# Patient Record
Sex: Female | Born: 1950 | Race: Black or African American | Hispanic: No | Smoking: Never smoker
Health system: Southern US, Community
[De-identification: ages and names within clinical notes are randomized; demographics above are authoritative.]

## PROBLEM LIST (undated history)

## (undated) DIAGNOSIS — R519 Headache, unspecified: Secondary | ICD-10-CM

## (undated) DIAGNOSIS — R51 Headache: Secondary | ICD-10-CM

## (undated) DIAGNOSIS — E119 Type 2 diabetes mellitus without complications: Secondary | ICD-10-CM

## (undated) DIAGNOSIS — G8929 Other chronic pain: Secondary | ICD-10-CM

---

## 1980-05-10 HISTORY — PX: ABDOMINAL HYSTERECTOMY: SHX81

## 1998-03-13 ENCOUNTER — Emergency Department (HOSPITAL_COMMUNITY): Admission: EM | Admit: 1998-03-13 | Discharge: 1998-03-13 | Payer: Self-pay | Admitting: Emergency Medicine

## 1998-03-13 ENCOUNTER — Encounter: Payer: Self-pay | Admitting: Emergency Medicine

## 1999-03-10 ENCOUNTER — Inpatient Hospital Stay (HOSPITAL_COMMUNITY): Admission: EM | Admit: 1999-03-10 | Discharge: 1999-03-13 | Payer: Self-pay | Admitting: Emergency Medicine

## 1999-03-10 ENCOUNTER — Encounter: Payer: Self-pay | Admitting: Emergency Medicine

## 1999-03-31 ENCOUNTER — Encounter: Admission: RE | Admit: 1999-03-31 | Discharge: 1999-03-31 | Payer: Self-pay | Admitting: Internal Medicine

## 1999-04-14 ENCOUNTER — Encounter: Admission: RE | Admit: 1999-04-14 | Discharge: 1999-04-14 | Payer: Self-pay | Admitting: Internal Medicine

## 1999-06-23 ENCOUNTER — Encounter: Admission: RE | Admit: 1999-06-23 | Discharge: 1999-06-23 | Payer: Self-pay | Admitting: Internal Medicine

## 1999-08-14 ENCOUNTER — Ambulatory Visit (HOSPITAL_COMMUNITY): Admission: RE | Admit: 1999-08-14 | Discharge: 1999-08-14 | Payer: Self-pay | Admitting: *Deleted

## 1999-08-14 ENCOUNTER — Encounter (INDEPENDENT_AMBULATORY_CARE_PROVIDER_SITE_OTHER): Payer: Self-pay | Admitting: *Deleted

## 2001-03-27 ENCOUNTER — Encounter: Payer: Self-pay | Admitting: Emergency Medicine

## 2001-03-27 ENCOUNTER — Emergency Department (HOSPITAL_COMMUNITY): Admission: EM | Admit: 2001-03-27 | Discharge: 2001-03-27 | Payer: Self-pay

## 2003-03-13 ENCOUNTER — Emergency Department (HOSPITAL_COMMUNITY): Admission: EM | Admit: 2003-03-13 | Discharge: 2003-03-13 | Payer: Self-pay | Admitting: Emergency Medicine

## 2008-05-02 ENCOUNTER — Emergency Department (HOSPITAL_COMMUNITY): Admission: EM | Admit: 2008-05-02 | Discharge: 2008-05-02 | Payer: Self-pay | Admitting: Emergency Medicine

## 2009-03-18 ENCOUNTER — Emergency Department (HOSPITAL_COMMUNITY): Admission: EM | Admit: 2009-03-18 | Discharge: 2009-03-18 | Payer: Self-pay | Admitting: Emergency Medicine

## 2010-04-02 ENCOUNTER — Emergency Department (HOSPITAL_COMMUNITY): Admission: EM | Admit: 2010-04-02 | Discharge: 2010-04-03 | Payer: Self-pay | Admitting: Emergency Medicine

## 2010-07-21 LAB — CBC
HCT: 36.4 % (ref 36.0–46.0)
Hemoglobin: 12.2 g/dL (ref 12.0–15.0)
MCHC: 33.5 g/dL (ref 30.0–36.0)
MCV: 93.5 fL (ref 78.0–100.0)

## 2010-07-21 LAB — URINALYSIS, ROUTINE W REFLEX MICROSCOPIC
Nitrite: NEGATIVE
Protein, ur: 30 mg/dL — AB
Specific Gravity, Urine: 1.01 (ref 1.005–1.030)
Urobilinogen, UA: 1 mg/dL (ref 0.0–1.0)

## 2010-07-21 LAB — HEPATIC FUNCTION PANEL
ALT: 16 U/L (ref 0–35)
AST: 20 U/L (ref 0–37)
Bilirubin, Direct: 0.1 mg/dL (ref 0.0–0.3)
Indirect Bilirubin: 0.9 mg/dL (ref 0.3–0.9)
Total Bilirubin: 1 mg/dL (ref 0.3–1.2)

## 2010-07-21 LAB — POCT CARDIAC MARKERS
CKMB, poc: <1 ng/mL — ABNORMAL LOW (ref 1.0–8.0)
Myoglobin, poc: 54.3 ng/mL (ref 12–200)

## 2010-07-21 LAB — URINE CULTURE: Culture  Setup Time: 201111250327

## 2010-07-21 LAB — DIFFERENTIAL
Basophils Relative: 0 % (ref 0–1)
Eosinophils Relative: 1 % (ref 0–5)
Monocytes Absolute: 0.2 10*3/uL (ref 0.1–1.0)
Monocytes Relative: 3 % (ref 3–12)
Neutro Abs: 6.2 10*3/uL (ref 1.7–7.7)

## 2010-07-21 LAB — CULTURE, BLOOD (ROUTINE X 2): Culture  Setup Time: 201111250039

## 2010-07-21 LAB — BASIC METABOLIC PANEL
BUN: 13 mg/dL (ref 6–23)
CO2: 25 mEq/L (ref 19–32)
Chloride: 104 mEq/L (ref 96–112)
GFR calc non Af Amer: >60 mL/min (ref >60)
Glucose, Bld: 132 mg/dL — ABNORMAL HIGH (ref 70–99)
Potassium: 3.5 mEq/L (ref 3.5–5.1)
Sodium: 140 mEq/L (ref 135–145)

## 2010-07-21 LAB — URINE MICROSCOPIC-ADD ON

## 2010-07-21 LAB — LACTIC ACID, PLASMA: Lactic Acid, Venous: 2.3 mmol/L — ABNORMAL HIGH (ref 0.5–2.2)

## 2010-09-25 NOTE — Procedures (Signed)
[place]  Patient:    Sandra Petersen, Sandra Petersen                  MRN: [id number] Proc. Date: 08/14/99 Adm. Date:  [id number] Disc. Date: [id number] Attending:  Mingo Amber CC:         Dr. Talmage Coin - [facility]                           Procedure Report  PROCEDURE:  Video upper endoscopy and video colonoscopy.  ENDOSCOPIST:  Roosvelt Harps, M.D.  INDICATIONS:  A 60 year old female with microcytic anemia of unclear etiology. Her hemoglobin checked in the office was 9.5 with an MCV of 82.5.  She has essentially no gastrointestinal symptoms except occasional nausea, but she has been taking Protonix for unclear reasons, probably reflux and she had been using ibuprofen or muscle aches and headaches.  This _______ has been discontinued.  PREPARATION:  She is n.p.o. since midnight.  In addition, she took Phospho-Soda  prep and a clear liquid diet yesterday.  The mucosa is clean.  PREPROCEDURE SEDATION:  Prior to the upper endoscopy, she received 70 mg of Demerol and 5 mg of Versed intravenously.  In addition, her throat was anesthetized with Hurricaine spray and was on 2 L of nasal cannula O2.  DESCRIPTION OF PROCEDURE:  The Olympus video upper endoscope was inserted via the mouth and advanced through the upper esophageal sphincter with ease. Intubation was then carried out well into the descending duodenum.  On withdrawal, the mucosa was carefully evaluated.  There was a suggestion of paucity of folds in the duodenum, thus biopsies were done to rule out sprue.  The duodenal bulb, antrum, and entire stomach appeared normal.  Retroflexed view of the GE junction was normal and the esophagus was entirely normal.  At the conclusion of this procedure, the patients position was reversed for procedure #2, video colonoscopy.  She required no further sedation.  The Olympus video colonoscope was inserted via the rectum, and  advanced very easily all the way to the cecum, and then through the ileocecal valve into the terminal ileum.  On withdrawal, the mucosa was carefully evaluated.  The terminal ileum nd entire colon including retroflexed view of the rectum were normal.  There was no area of inflammation, neoplasia, or diverticulosis seen.  The patient tolerated the procedures well.  Pulse, blood pressure, and oximetry  testing were stable throughout.  She was observed in recovery for 45 minutes and discharged home alert with a benign abdomen.  IMPRESSION:  Microcytic anemia, unexplained by endoscopic procedures.  There is a minimal suggestion of flattening of the villi and ________ folds in the small bowel.  Biopsy has been done.  PLAN:  The patient will be placed on ferrous gluconate 325 mg b.i.d.  She is to  return to the office in three weeks for recheck.  I probably will repeat CBC and iron studies at that time, and review small bowel biopsies with her. DD:  08/14/99 TD:  08/17/99 Job: [id number]

## 2012-01-18 ENCOUNTER — Encounter (HOSPITAL_COMMUNITY): Payer: Self-pay | Admitting: Emergency Medicine

## 2012-01-18 ENCOUNTER — Emergency Department (HOSPITAL_COMMUNITY)
Admission: EM | Admit: 2012-01-18 | Discharge: 2012-01-18 | Disposition: A | Discharge status: Home / Self Care | Payer: 59 | Attending: Emergency Medicine | Admitting: Emergency Medicine

## 2012-01-18 DIAGNOSIS — M545 Low back pain, unspecified: Secondary | ICD-10-CM | POA: Insufficient documentation

## 2012-01-18 MED ORDER — OXYCODONE-ACETAMINOPHEN 5-325 MG PO TABS: 1.0000 | ORAL_TABLET | Freq: Four times a day (QID) | ORAL | Status: AC | PRN

## 2012-01-18 MED ORDER — OXYCODONE-ACETAMINOPHEN 5-325 MG PO TABS
1.0000 | ORAL_TABLET | Freq: Once | ORAL | Status: AC
  Administered 2012-01-18: 1 via ORAL
  Filled 2012-01-18: qty 1

## 2012-01-18 NOTE — ED Notes (Signed)
Pt states her lower back hurts and the pain radiates upward and sometimes it causes her right leg to be painful and sometimes numb  Pt states her pain started on Sunday  Denies any recent injury

## 2012-01-18 NOTE — ED Provider Notes (Signed)
History     CSN: [id number]  Arrival date & time 01/18/12  4098   First MD Initiated Contact with Patient 01/18/12 [phone]      Chief Complaint  Patient presents with  . Back Pain    (Consider location/radiation/quality/duration/timing/severity/associated sxs/prior treatment) Patient is a 61 y.o. female presenting with back pain. The history is provided by the patient.  Back Pain  Pertinent negatives include no fever, no numbness, no abdominal pain, no dysuria and no weakness.   61 year old, female, with no significant past medical history presents emergency department complaining of lower back pain.  That extends up.  Her back, and into her right lower extremity.  The pain has been present for 3 days.  She denies trauma.  She denies nausea, vomiting, fevers, chills.  She denies dysuria, or hematuria.  She denies bowel or bladder incontinence.  She denies weakness in her lower extremities.  History reviewed. No pertinent past medical history.  History reviewed. No pertinent past surgical history.  Family History  Problem Relation Age of Onset  . Diabetes Sister     History  Substance Use Topics  . Smoking status: Never Smoker   . Smokeless tobacco: Not on file  . Alcohol Use: No    OB History    Grav Para Term Preterm Abortions TAB SAB Ect Mult Living                  Review of Systems  Constitutional: Negative for fever, chills, fatigue and unexpected weight change.  HENT: Negative for neck pain.   Gastrointestinal: Negative for nausea, vomiting and abdominal pain.  Genitourinary: Negative for dysuria and hematuria.  Musculoskeletal: Positive for back pain.  Neurological: Negative for weakness and numbness.    Allergies  Review of patient's allergies indicates no known allergies.  Home Medications  No current outpatient prescriptions on file.  BP 125/71  Pulse 65  Temp 98.6 F (37 C) (Oral)  Resp 18  SpO2 98%  Physical Exam  Nursing note and vitals  reviewed. Constitutional: She is oriented to person, place, and time. She appears well-developed and well-nourished. No distress.  HENT:  Head: Normocephalic and atraumatic.  Eyes: Conjunctivae and EOM are normal.  Neck: Normal range of motion. Neck supple.  Cardiovascular: Normal rate and intact distal pulses.   No murmur heard. Pulmonary/Chest: Effort normal and breath sounds normal. No respiratory distress.  Abdominal: Soft. Bowel sounds are normal. She exhibits no distension. There is no tenderness.  Musculoskeletal: Normal range of motion. She exhibits no edema.       Mild tenderness in the paravertebral area bilaterally, as well.  As over the bilateral SI joints  Neurological: She is alert and oriented to person, place, and time. No cranial nerve deficit.  Skin: Skin is warm and dry.  Psychiatric: She has a normal mood and affect. Thought content normal.    ED Course  Procedures (including critical care time) nontraumatic low back pain, with no signs of systemic illness.  No evidence of weakness in the lower extremities.  No tests indicated  Labs Reviewed - No data to display No results found.   No diagnosis found.    MDM  Low back pain        Cheri Guppy, MD 01/18/12 [phone]

## 2012-05-05 ENCOUNTER — Emergency Department (HOSPITAL_COMMUNITY)
Admission: EM | Admit: 2012-05-05 | Discharge: 2012-05-05 | Disposition: A | Discharge status: Other Acute Inpatient Hospital | Payer: 59 | Source: Home / Self Care | Attending: Emergency Medicine | Admitting: Emergency Medicine

## 2012-05-05 ENCOUNTER — Encounter (HOSPITAL_COMMUNITY): Payer: Self-pay

## 2012-05-05 ENCOUNTER — Emergency Department (HOSPITAL_COMMUNITY)
Admission: EM | Admit: 2012-05-05 | Discharge: 2012-05-06 | Disposition: A | Discharge status: Home / Self Care | Payer: 59 | Attending: Emergency Medicine | Admitting: Emergency Medicine

## 2012-05-05 ENCOUNTER — Encounter (HOSPITAL_COMMUNITY): Payer: Self-pay | Admitting: Emergency Medicine

## 2012-05-05 ENCOUNTER — Emergency Department (HOSPITAL_COMMUNITY): Payer: 59

## 2012-05-05 DIAGNOSIS — M79609 Pain in unspecified limb: Secondary | ICD-10-CM | POA: Insufficient documentation

## 2012-05-05 DIAGNOSIS — R51 Headache: Secondary | ICD-10-CM

## 2012-05-05 MED ORDER — DIPHENHYDRAMINE HCL 50 MG/ML IJ SOLN
25.0000 mg | Freq: Once | INTRAMUSCULAR | Status: AC
  Administered 2012-05-05: 25 mg via INTRAVENOUS
  Filled 2012-05-05: qty 1

## 2012-05-05 MED ORDER — SODIUM CHLORIDE 0.9 % IV BOLUS (SEPSIS)
1000.0000 mL | Freq: Once | INTRAVENOUS | Status: AC
  Administered 2012-05-05: 1000 mL via INTRAVENOUS

## 2012-05-05 MED ORDER — METOCLOPRAMIDE HCL 5 MG/ML IJ SOLN
5.0000 mg | Freq: Once | INTRAMUSCULAR | Status: AC
  Administered 2012-05-05: 5 mg via INTRAVENOUS
  Filled 2012-05-05: qty 2

## 2012-05-05 MED ORDER — DEXAMETHASONE SODIUM PHOSPHATE 10 MG/ML IJ SOLN
10.0000 mg | Freq: Once | INTRAMUSCULAR | Status: AC
  Administered 2012-05-05: 10 mg via INTRAVENOUS
  Filled 2012-05-05: qty 1

## 2012-05-05 NOTE — ED Notes (Signed)
Patient transported to CT 

## 2012-05-05 NOTE — ED Notes (Signed)
Pt was at dinner today with husband when headache started. Pt finished dinner and headache progressively worsened throughout the day. Pt has frequent headaches, but she states this headache is the worst. Reports headache is on the right side of head and spreads down to neck. Pt denies taking daily meds, denies hypertension, stress, allergies, nor dehydration. A&Ox4, ambulatory, stable, husband at bedside.

## 2012-05-05 NOTE — ED Notes (Signed)
PT. TRANSFERRED FROM [facility] THIS EVENING , REPORTS HEADACHE ONSET THIS AFTERNOON , DENIES INJURY , AMBULATORY , NO NAUSEA OR VOMITTING . DENIES BLURRED VISION . ALERT Sandra Petersen , SPEECH CLEAR/ NO FACIAL ASYMMETRY, EQUAL GRIPS.

## 2012-05-05 NOTE — ED Notes (Signed)
C/o HA today, not relieved w tylenol; denies other syx other than HA

## 2012-05-05 NOTE — ED Provider Notes (Signed)
Chief Complaint  Patient presents with  . Headache    History of Present Illness:   This is a 61 year old female who presents with a one-day history of a severe headache with sudden onset around 5 PM this evening while she was eating dinner. It involved the right side of her head and centered around her right ear. The patient states her neck hurts and feels stiff and she states this is the worst headache she's ever had. It is a throbbing headache rated at 13/10 in intensity. She denies any nausea, vomiting, photophobia, phonophobia, osmophobia. Her right leg feels numb. She has some aching in both hands but no numbness or tingling. There is no weakness of the arms or the legs. She denies any diplopia, blurred vision, or visual auras. She's had no fever, chills, nasal congestion, rhinorrhea, sore throat, or cough. The patient states she's had headaches for about 2-3 months off and on, these are similar but relieved by Tylenol and not nearly as severe. She denies any history of migraine headache. She denies any jaw claudication.  Review of Systems:  Other than noted above, the patient denies any of the following symptoms: Systemic:  No fever, chills, fatigue, photophobia, stiff neck. Eye:  No redness, eye pain, discharge, blurred vision, or diplopia. ENT:  No nasal congestion, rhinorrhea, sinus pressure or pain, sneezing, earache, or sore throat.  No jaw claudication. Neuro:  No paresthesias, loss of consciousness, seizure activity, muscle weakness, trouble with coordination or gait, trouble speaking or swallowing. Psych:  No depression, anxiety or trouble sleeping.  PMFSH:  Past medical history, family history, social history, meds, and allergies were reviewed.  Physical Exam:   Vital signs:  BP 143/82  Pulse 68  Temp 99 F (37.2 C) (Oral)  Resp 18  SpO2 99% General:  Alert and oriented.  In no distress. Eye:  Lids and conjunctivas normal.  PERRL,  Full EOMs.  Fundi benign with normal discs  and vessels. ENT:  No cranial or facial tenderness to palpation.  TMs and canals clear.  Nasal mucosa was normal and uncongested without any drainage. No intra oral lesions, pharynx clear, mucous membranes moist, dentition normal. Neck:  Supple, full ROM, no tenderness to palpation.  No adenopathy or mass. Neuro:  Alert and orented times 3.  Speech was clear, fluent, and appropriate.  Cranial nerves intact. No pronator drift, muscle strength normal. Finger to nose normal.  DTRs were 2+ and symmetrical.Station and gait were normal.  Romberg's sign was normal.  Able to perform tandem gait well. Psych:  Normal affect.  Assessment:  The encounter diagnosis was Headache.  Differential diagnosis is migraine headache, although this would be unusual for onset in a woman of her age. Aneurysmal headache as suggested by features of stiff neck, sudden onset, and her headache being described as the worst headache ever. At her age temporal arteritis would also be a possibility.  Plan:   1.  The following meds were prescribed:   New Prescriptions   No medications on file   2.  The patient was transported to the emergency department via shuttle.   Reuben Likes, MD 05/05/12 2017

## 2012-05-06 NOTE — ED Provider Notes (Signed)
Medical screening examination/treatment/procedure(s) were performed by non-physician practitioner and as supervising physician I was immediately available for consultation/collaboration.  Rasheed Welty L Abria Vannostrand, MD 05/06/12 1213 

## 2012-05-06 NOTE — ED Provider Notes (Signed)
History     CSN: [id number]  Arrival date & time 05/05/12  2023   None     Chief Complaint  Patient presents with  . Headache    (Consider location/radiation/quality/duration/timing/severity/associated sxs/prior treatment) HPI History provided by pt.   Pt presents w/ severe, right-sided headache.  Describes as gradual onset severe pressure, throbbing and sharp pain that started at 3-4pm today.  Radiates into neck.  Has not taken anything for pain.  No associated fever, photo/phonophobia, vision changes, dizziness, extremity weakness/paresthesias.  Has had RLE pain and tingling intermittently for a long time and attributes to being on her feet a lot at work.  Remote h/o migraines.   Denies recent head trauma.  History reviewed. No pertinent past medical history.  History reviewed. No pertinent past surgical history.  Family History  Problem Relation Age of Onset  . Diabetes Sister     History  Substance Use Topics  . Smoking status: Never Smoker   . Smokeless tobacco: Not on file  . Alcohol Use: No    OB History    Grav Para Term Preterm Abortions TAB SAB Ect Mult Living                  Review of Systems  All other systems reviewed and are negative.    Allergies  Review of patient's allergies indicates no known allergies.  Home Medications   Current Outpatient Rx  Name  Route  Sig  Dispense  Refill  . IBUPROFEN 200 MG PO TABS   Oral   Take 400 mg by mouth every 6 (six) hours as needed. For pain           BP 133/59  Pulse 69  Temp 98.4 F (36.9 C) (Oral)  Resp 18  SpO2 100%  Physical Exam  Nursing note and vitals reviewed. Constitutional: She is oriented to person, place, and time. She appears well-developed and well-nourished.  HENT:  Head: Normocephalic and atraumatic.       No tenderness of sinuses or temples.  No jaw claudication.   Eyes:       Normal appearance  Neck: Normal range of motion. Neck supple. No rigidity. No Brudzinski's sign  and no Kernig's sign noted.  Cardiovascular: Normal rate, regular rhythm and intact distal pulses.   Pulmonary/Chest: Effort normal and breath sounds normal.  Musculoskeletal: Normal range of motion.  Neurological: She is alert and oriented to person, place, and time. No sensory deficit. Coordination normal.       CN 3-12 intact.  No nystagmus.  5/5 and equal upper and lower extremity strength.  No past pointing.    Skin: Skin is warm and dry. No rash noted.  Psychiatric: She has a normal mood and affect. Her behavior is normal.    ED Course  Procedures (including critical care time)  Labs Reviewed - No data to display Ct Head Wo Contrast  05/05/2012  *RADIOLOGY REPORT*  Clinical Data: Severe headache.  CT HEAD WITHOUT CONTRAST  Technique:  Contiguous axial images were obtained from the base of the skull through the vertex without contrast.  Comparison: None.  Findings: There is no evidence of acute infarction, mass lesion, or intra- or extra-axial hemorrhage on CT.  The posterior fossa, including the cerebellum, brainstem and fourth ventricle, is within normal limits.  The third and lateral ventricles, and basal ganglia are unremarkable in appearance.  The cerebral hemispheres are symmetric in appearance, with normal gray- white differentiation.  No mass effect or  midline shift is seen.  There is no evidence of fracture; visualized osseous structures are unremarkable in appearance.  The visualized portions of the orbits are within normal limits.  There is mild partial opacification of the left maxillary sinus.  The remaining paranasal sinuses and mastoid air cells are well-aerated.  No significant soft tissue abnormalities are seen.  IMPRESSION:  1.  No acute intracranial pathology seen on CT. 2.  Mild partial opacification of the left maxillary sinus.   Original Report Authenticated By: Tonia Ghent, M.D.      1. Headache       MDM  [phone] F presents w/ severe, non-traumatic R-sided  headache.  Referred by [facility] for further evaluation and treatment.  I have very low clinical suspicion for intracranial hemorrhage; no trauma, not anti-coagulated, no h/o HTN, nml neurologic exam, no meningeal signs and nml head CT.  No tenderness of head/temple, fever or jaw claudication to suggest temporal arteritis.  Pain resolved w/ IV reglan, decadron, benadryl and NS.  Referred to neuro and [facility].  Return precautions discussed. 12:33 AM         Otilio Miu, PA-C 05/06/12 [phone]

## 2012-05-13 ENCOUNTER — Encounter (HOSPITAL_COMMUNITY): Payer: Self-pay | Admitting: Emergency Medicine

## 2012-05-13 ENCOUNTER — Emergency Department (HOSPITAL_COMMUNITY)
Admission: EM | Admit: 2012-05-13 | Discharge: 2012-05-13 | Disposition: A | Discharge status: Home / Self Care | Payer: 59 | Attending: Emergency Medicine | Admitting: Emergency Medicine

## 2012-05-13 DIAGNOSIS — G8929 Other chronic pain: Secondary | ICD-10-CM | POA: Insufficient documentation

## 2012-05-13 DIAGNOSIS — N390 Urinary tract infection, site not specified: Secondary | ICD-10-CM | POA: Insufficient documentation

## 2012-05-13 DIAGNOSIS — R51 Headache: Secondary | ICD-10-CM | POA: Insufficient documentation

## 2012-05-13 HISTORY — DX: Headache: R51

## 2012-05-13 HISTORY — DX: Headache, unspecified: R51.9

## 2012-05-13 HISTORY — DX: Other chronic pain: G89.29

## 2012-05-13 LAB — URINALYSIS, ROUTINE W REFLEX MICROSCOPIC
Bilirubin Urine: NEGATIVE
Ketones, ur: NEGATIVE mg/dL
Protein, ur: NEGATIVE mg/dL
Urobilinogen, UA: 1 mg/dL (ref 0.0–1.0)

## 2012-05-13 MED ORDER — CEPHALEXIN 500 MG PO CAPS: 500.0000 mg | ORAL_CAPSULE | Freq: Four times a day (QID) | ORAL | Status: DC

## 2012-05-13 MED ORDER — PHENAZOPYRIDINE HCL 200 MG PO TABS: 200.0000 mg | ORAL_TABLET | Freq: Three times a day (TID) | ORAL | Status: DC | PRN

## 2012-05-13 MED ORDER — CEPHALEXIN 500 MG PO CAPS
500.0000 mg | ORAL_CAPSULE | Freq: Once | ORAL | Status: AC
  Administered 2012-05-13: 500 mg via ORAL
  Filled 2012-05-13 (×2): qty 1

## 2012-05-13 MED ORDER — PHENAZOPYRIDINE HCL 200 MG PO TABS
200.0000 mg | ORAL_TABLET | Freq: Three times a day (TID) | ORAL | Status: DC | PRN
Start: 2012-05-13 — End: 2012-05-13

## 2012-05-13 NOTE — ED Notes (Addendum)
Patient reports burning pain on urination and denies fevers, N/V/D, or hematuria.

## 2012-05-13 NOTE — ED Provider Notes (Signed)
Medical screening examination/treatment/procedure(s) were performed by non-physician practitioner and as supervising physician I was immediately available for consultation/collaboration.   Celene Kras, MD 05/13/12 2127

## 2012-05-13 NOTE — ED Provider Notes (Signed)
History     CSN: [id number]  Arrival date & time 05/13/12  4098   First MD Initiated Contact with Patient 05/13/12 2012      Chief Complaint  Patient presents with  . Dysuria   HPI  History provided by the patient. Patient is a 62 year old female with no significant PMH who presents with complaints of dysuria for the past few days. Patient reports first noticing symptoms Wednesday evening. She has burning only with urination. She denies any urinary frequency, hematuria, abdominal pain, flank pain. She denies any fever, chills or sweats. Denies any vaginal bleeding or discharge. Denies any skin rashes or lesions. Symptoms are similar to previous UTI 2 years ago. Patient is not using treatment for symptoms. Denies any other aggravating or alleviating factors.    Past Medical History  Diagnosis Date  . Chronic headaches     History reviewed. No pertinent past surgical history.  Family History  Problem Relation Age of Onset  . Diabetes Sister     History  Substance Use Topics  . Smoking status: Never Smoker   . Smokeless tobacco: Not on file  . Alcohol Use: No    OB History    Grav Para Term Preterm Abortions TAB SAB Ect Mult Living                  Review of Systems  All other systems reviewed and are negative.    Allergies  Review of patient's allergies indicates no known allergies.  Home Medications   Current Outpatient Rx  Name  Route  Sig  Dispense  Refill  . IBUPROFEN 200 MG PO TABS   Oral   Take 400 mg by mouth every 6 (six) hours as needed. For pain           BP 129/69  Pulse 74  Temp 98.8 F (37.1 C) (Oral)  Resp 20  Ht 5\' 2"  (1.575 m)  Wt 135 lb (61.236 kg)  BMI 24.69 kg/m2  SpO2 99%  Physical Exam  Nursing note and vitals reviewed. Constitutional: She is oriented to person, place, and time. She appears well-developed and well-nourished. No distress.  HENT:  Head: Normocephalic.  Cardiovascular: Normal rate and regular rhythm.   No  murmur heard. Pulmonary/Chest: Effort normal and breath sounds normal. No respiratory distress. She has no wheezes. She has no rales.  Abdominal: Soft. She exhibits no distension. There is no tenderness. There is no rebound and no guarding.       No CVA tenderness  Neurological: She is alert and oriented to person, place, and time.  Skin: Skin is warm and dry.  Psychiatric: She has a normal mood and affect. Her behavior is normal.    ED Course  Procedures   Results for orders placed during the hospital encounter of 05/13/12  URINALYSIS, ROUTINE W REFLEX MICROSCOPIC      Component Value Range   Color, Urine YELLOW  YELLOW   APPearance CLEAR  CLEAR   Specific Gravity, Urine 1.016  1.005 - 1.030   pH 7.5  5.0 - 8.0   Glucose, UA NEGATIVE  NEGATIVE mg/dL   Hgb urine dipstick SMALL (*) NEGATIVE   Bilirubin Urine NEGATIVE  NEGATIVE   Ketones, ur NEGATIVE  NEGATIVE mg/dL   Protein, ur NEGATIVE  NEGATIVE mg/dL   Urobilinogen, UA 1.0  0.0 - 1.0 mg/dL   Nitrite NEGATIVE  NEGATIVE   Leukocytes, UA SMALL (*) NEGATIVE  URINE MICROSCOPIC-ADD ON      Component  Value Range   Squamous Epithelial / LPF RARE  RARE   WBC, UA 3-6  <3 WBC/hpf   RBC / HPF 0-2  <3 RBC/hpf   Bacteria, UA RARE  RARE     1. UTI (lower urinary tract infection)       MDM  8:10 PM patient seen and evaluated. Patient well-appearing in no acute distress. Patient does not appear severely ill or toxic.  Patient with symptoms suggestive of UTI. UA with slight signs of UTI. This may be early and developing. We'll give prescriptions for antibiotic.      Angus Seller, [place] 05/13/12 2043

## 2012-06-16 ENCOUNTER — Other Ambulatory Visit (INDEPENDENT_AMBULATORY_CARE_PROVIDER_SITE_OTHER): Payer: 59

## 2012-06-16 ENCOUNTER — Ambulatory Visit (INDEPENDENT_AMBULATORY_CARE_PROVIDER_SITE_OTHER): Payer: 59 | Admitting: Internal Medicine

## 2012-06-16 ENCOUNTER — Encounter: Payer: Self-pay | Admitting: Internal Medicine

## 2012-06-16 VITALS — BP 132/76 | HR 68 | Temp 99.2°F | Ht 62.0 in | Wt 146.5 lb

## 2012-06-16 DIAGNOSIS — Z131 Encounter for screening for diabetes mellitus: Secondary | ICD-10-CM

## 2012-06-16 DIAGNOSIS — Z1329 Encounter for screening for other suspected endocrine disorder: Secondary | ICD-10-CM

## 2012-06-16 DIAGNOSIS — Z0000 Encounter for general adult medical examination without abnormal findings: Secondary | ICD-10-CM

## 2012-06-16 DIAGNOSIS — Z1239 Encounter for other screening for malignant neoplasm of breast: Secondary | ICD-10-CM

## 2012-06-16 DIAGNOSIS — Z13228 Encounter for screening for other metabolic disorders: Secondary | ICD-10-CM

## 2012-06-16 DIAGNOSIS — Z13220 Encounter for screening for lipoid disorders: Secondary | ICD-10-CM

## 2012-06-16 DIAGNOSIS — Z1321 Encounter for screening for nutritional disorder: Secondary | ICD-10-CM

## 2012-06-16 DIAGNOSIS — Z130 Encounter for screening for diseases of the blood and blood-forming organs and certain disorders involving the immune mechanism: Secondary | ICD-10-CM

## 2012-06-16 LAB — BASIC METABOLIC PANEL
CO2: 29 mEq/L (ref 19–32)
Calcium: 9.7 mg/dL (ref 8.4–10.5)
Creatinine, Ser: 0.7 mg/dL (ref 0.4–1.2)
Glucose, Bld: 117 mg/dL — ABNORMAL HIGH (ref 70–99)
Sodium: 140 mEq/L (ref 135–145)

## 2012-06-16 LAB — CBC
HCT: 36.1 % (ref 36.0–46.0)
Hemoglobin: 12 g/dL (ref 12.0–15.0)
Platelets: 235 10*3/uL (ref 150.0–400.0)
RBC: 3.94 Mil/uL (ref 3.87–5.11)
WBC: 6.8 10*3/uL (ref 4.5–10.5)

## 2012-06-16 LAB — TSH: TSH: 1.72 u[IU]/mL (ref 0.35–5.50)

## 2012-06-16 LAB — LIPID PANEL
HDL: 44.8 mg/dL (ref >39.00)
LDL Cholesterol: 81 mg/dL (ref 0–99)
VLDL: 30.8 mg/dL (ref 0.0–40.0)

## 2012-06-16 NOTE — Patient Instructions (Signed)

## 2012-06-16 NOTE — Progress Notes (Signed)
HPI  Pt presents to the clinic today to establish care. She has not had a PCP in the past, just uses the ER for things that she needs. She has no concerns today.  Flu: never Tetanus: never: LMP: post menopausal Pap smear: > 4 years Mammogram: > 4 years Eye doctor: no Dentist: no  Past Medical History  Diagnosis Date  . Chronic headaches     Current Outpatient Prescriptions  Medication Sig Dispense Refill  . ibuprofen (ADVIL,MOTRIN) 200 MG tablet Take 400 mg by mouth every 6 (six) hours as needed. For pain        No Known Allergies  Family History  Problem Relation Age of Onset  . Diabetes Sister   . Hypertension Other     Parent    History   Social History  . Marital Status: Married    Spouse Name: N/A    Number of Children: N/A  . Years of Education: 12   Occupational History  . [employer]    Social History Main Topics  . Smoking status: Never Smoker   . Smokeless tobacco: Never Used  . Alcohol Use: No  . Drug Use: No  . Sexually Active:    Other Topics Concern  . Not on file   Social History Narrative   Regular exercise-yesCaffeine Use-yes    ROS:  Constitutional: Denies fever, malaise, fatigue, headache or abrupt weight changes.  HEENT: Denies eye pain, eye redness, ear pain, ringing in the ears, wax buildup, runny nose, nasal congestion, bloody nose, or sore throat. Respiratory: Denies difficulty breathing, shortness of breath, cough or sputum production.   Cardiovascular: Denies chest pain, chest tightness, palpitations or swelling in the hands or feet.  Gastrointestinal: Denies abdominal pain, bloating, constipation, diarrhea or blood in the stool.  GU: Denies frequency, urgency, pain with urination, blood in urine, odor or discharge. Musculoskeletal: Denies decrease in range of motion, difficulty with gait, muscle pain or joint pain and swelling.  Skin: Denies redness, rashes, lesions or ulcercations.  Neurological: Denies dizziness, difficulty  with memory, difficulty with speech or problems with balance and coordination.   No other specific complaints in a complete review of systems (except as listed in HPI above).  PE:  BP 132/76  Pulse 68  Temp 99.2 F (37.3 C) (Oral)  Ht 5\' 2"  (1.575 m)  Wt 146 lb 8 oz (66.452 kg)  BMI 26.80 kg/m2  SpO2 98% Wt Readings from Last 3 Encounters:  06/16/12 146 lb 8 oz (66.452 kg)  05/13/12 135 lb (61.236 kg)    General: Appears her stated age, well developed, well nourished in NAD. HEENT: Head: normal shape and size; Eyes: sclera white, no icterus, conjunctiva pink, PERRLA and EOMs intact; Ears: Tm's gray and intact, normal light reflex; Nose: mucosa pink and moist, septum midline; Throat/Mouth: Teeth present, mucosa pink and moist, no lesions or ulcerations noted.  Neck: Normal range of motion. Neck supple, trachea midline. No massses, lumps or thyromegaly present.  Cardiovascular: Normal rate and rhythm. S1,S2 noted.  No murmur, rubs or gallops noted. No JVD or BLE edema. No carotid bruits noted. Pulmonary/Chest: Normal effort and positive vesicular breath sounds. No respiratory distress. No wheezes, rales or ronchi noted.  Abdomen: Soft and nontender. Normal bowel sounds, no bruits noted. No distention or masses noted. Liver, spleen and kidneys non palpable. Musculoskeletal: Normal range of motion. No signs of joint swelling. No difficulty with gait.  Neurological: Alert and oriented. Cranial nerves II-XII intact. Coordination normal. +DTRs bilaterally. Psychiatric:  Mood and affect normal. Behavior is normal. Judgment and thought content normal.    Assessment and Plan:  Preventative Health Maintenance:  Start a diet and exercise program Will obtain basic screening labs today Pt declines flu, tetanus, colonoscopy and pap smear Will set up for mammogram  RTC in 1 year or sooner if needed

## 2012-06-17 LAB — VITAMIN D 25 HYDROXY (VIT D DEFICIENCY, FRACTURES): Vit D, 25-Hydroxy: 11 ng/mL — ABNORMAL LOW (ref 30–89)

## 2012-06-21 ENCOUNTER — Ambulatory Visit (INDEPENDENT_AMBULATORY_CARE_PROVIDER_SITE_OTHER): Payer: 59 | Admitting: Internal Medicine

## 2012-06-21 ENCOUNTER — Encounter: Payer: Self-pay | Admitting: Internal Medicine

## 2012-06-21 VITALS — BP 132/72 | HR 64 | Temp 98.1°F | Ht 62.0 in | Wt 147.0 lb

## 2012-06-21 DIAGNOSIS — E559 Vitamin D deficiency, unspecified: Secondary | ICD-10-CM

## 2012-06-21 DIAGNOSIS — E119 Type 2 diabetes mellitus without complications: Secondary | ICD-10-CM

## 2012-06-21 MED ORDER — ERGOCALCIFEROL 1.25 MG (50000 UT) PO CAPS: 50000.0000 [IU] | ORAL_CAPSULE | ORAL | Status: DC

## 2012-06-21 MED ORDER — METFORMIN HCL 500 MG PO TABS: 500.0000 mg | ORAL_TABLET | Freq: Two times a day (BID) | ORAL | Status: DC

## 2012-06-21 NOTE — Assessment & Plan Note (Signed)
Counseled patient on diet and exercise changes Showed patient how to use blood sugar machine Referred pt to diabetes and nutritional education eRx for Metformin BID

## 2012-06-21 NOTE — Patient Instructions (Signed)
Diabetes and Exercise Regular exercise is important and can help:   Control blood glucose (sugar).  Decrease blood pressure.    Control blood lipids (cholesterol, triglycerides).  Improve overall health. BENEFITS FROM EXERCISE  Improved fitness.  Improved flexibility.  Improved endurance.  Increased bone density.  Weight control.  Increased muscle strength.  Decreased body fat.  Improvement of the body's use of insulin, a hormone.  Increased insulin sensitivity.  Reduction of insulin needs.  Reduced stress and tension.  Helps you feel better. People with diabetes who add exercise to their lifestyle gain additional benefits, including:  Weight loss.  Reduced appetite.  Improvement of the body's use of blood glucose.  Decreased risk factors for heart disease:  Lowering of cholesterol and triglycerides.  Raising the level of good cholesterol (high-density lipoproteins, HDL).  Lowering blood sugar.  Decreased blood pressure. TYPE 1 DIABETES AND EXERCISE  Exercise will usually lower your blood glucose.  If blood glucose is greater than 240 mg/dl, check urine ketones. If ketones are present, do not exercise.  Location of the insulin injection sites may need to be adjusted with exercise. Avoid injecting insulin into areas of the body that will be exercised. For example, avoid injecting insulin into:  The arms when playing tennis.  The legs when jogging. For more information, discuss this with your caregiver.  Keep a record of:  Food intake.  Type and amount of exercise.  Expected peak times of insulin action.  Blood glucose levels. Do this before, during, and after exercise. Review your records with your caregiver. This will help you to develop guidelines for adjusting food intake and insulin amounts.  TYPE 2 DIABETES AND EXERCISE  Regular physical activity can help control blood glucose.  Exercise is important because it may:  Increase the  body's sensitivity to insulin.  Improve blood glucose control.  Exercise reduces the risk of heart disease. It decreases serum cholesterol and triglycerides. It also lowers blood pressure.  Those who take insulin or oral hypoglycemic agents should watch for signs of hypoglycemia. These signs include dizziness, shaking, sweating, chills, and confusion.  Body water is lost during exercise. It must be replaced. This will help to avoid loss of body fluids (dehydration) or heat stroke. Be sure to talk to your caregiver before starting an exercise program to make sure it is safe for you. Remember, any activity is better than none.  Document Released: 07/17/2003 Document Revised: 07/19/2011 Document Reviewed: 10/31/2008 [facility] Patient Information 2013 [place], [place]. Diabetes and Foot Care Diabetes may cause you to have a poor blood supply (circulation) to your legs and feet. Because of this, the skin may be thinner, break easier, and heal more slowly. You also may have nerve damage in your legs and feet causing decreased feeling. You may not notice minor injuries to your feet that could lead to serious problems or infections. Taking care of your feet is one of the most important things you can do for yourself.  HOME CARE INSTRUCTIONS  Do not go barefoot. Bare feet are easily injured.  Check your feet daily for blisters, cuts, and redness.  Wash your feet with warm water (not hot) and mild soap. Pat your feet and between your toes until completely dry.  Apply a moisturizing lotion that does not contain alcohol or petroleum jelly to the dry skin on your feet and to dry brittle toenails. Do not put it between your toes.  Trim your toenails straight across. Do not dig under them or around  the cuticle.  Do not cut corns or calluses, or try to remove them with medicine.  Wear clean cotton socks or stockings every day. Make sure they are not too tight. Do not wear knee high stockings since they may  decrease blood flow to your legs.  Wear leather shoes that fit properly and have enough cushioning. To break in new shoes, wear them just a few hours a day to avoid injuring your feet.  Wear shoes at all times, even in the house.  Do not cross your legs. This may decrease the blood flow to your feet.  If you find a minor scrape, cut, or break in the skin on your feet, keep it and the skin around it clean and dry. These areas may be cleansed with mild soap and water. Do not use peroxide, alcohol, iodine or Merthiolate.  When you remove an adhesive bandage, be sure not to harm the skin around it.  If you have a wound, look at it several times a day to make sure it is healing.  Do not use heating pads or hot water bottles. Burns can occur. If you have lost feeling in your feet or legs, you may not know it is happening until it is too late.  Report any cuts, sores or bruises to your caregiver. Do not wait! SEEK MEDICAL CARE IF:   You have an injury that is not healing or you notice redness, numbness, burning, or tingling.  Your feet always feel cold.  You have pain or cramps in your legs and feet. SEEK IMMEDIATE MEDICAL CARE IF:   There is increasing redness, swelling, or increasing pain in the wound.  There is a red line that goes up your leg.  Pus is coming from a wound.  You develop an unexplained oral temperature above 102 F (38.9 C), or as your caregiver suggests.  You notice a bad smell coming from an ulcer or wound. MAKE SURE YOU:   Understand these instructions.  Will watch your condition.  Will get help right away if you are not doing well or get worse. Document Released: 04/23/2000 Document Revised: 07/19/2011 Document Reviewed: 10/30/2008 [facility] Patient Information 2013 [place], [place]. Diabetes and Standards of Medical Care  Diabetes is complicated. You may find that your diabetes team includes a dietitian, nurse, diabetes educator, eye doctor, and more. To  help everyone know what is going on and to help you get the care you deserve, the following schedule of care was developed to help keep you on track. Below are the tests, exams, vaccines, medicines, education, and plans you will need. A1c test  Performed at least 2 times a year if you are meeting treatment goals.  Performed 4 times a year if therapy has changed or if you are not meeting therapy/glycemic goals. Aspirin medicine  Take daily as directed by your caregiver. Blood pressure test  Performed at every routine medical visit. The goal is less than 130/80 mm/Hg. Dental exam  Get a dental exam at least 2 times a year. Dilated eye exam (retinal exam)  Type 1 diabetes: Get an exam within 5 years of diagnosis and then yearly.  Type 2 diabetes: Get an exam at diagnosis and then yearly. All exams thereafter can be extended to every 2 to 3 years if one or more exams have been normal. Foot care exam  Visual foot exams are performed at every routine medical visit. The exams check for cuts, injuries, or other problems with the feet.  A comprehensive foot exam should be done yearly. This includes visual inspection as well as assessing foot pulses and testing for loss of sensation. Kidney function test (urine microalbumin)  Performed once a year.  Type 1 diabetes: The first test is performed 5 years after diagnosis.  Type 2 diabetes: The first test is performed at the time of diagnosis.  A serum creatinine and estimated glomerular filtration rate (eGFR) test is done once a year to tell the level of chronic kidney disease (CKD), if present. Lipid profile (Cholesterol, HDL, LDL, Triglycerides)  Performed once a year for most people. If at low risk, may be assessed every 2 years.  The goal for LDL is less than 100 mg/dl. If at high risk, the goal is less than 70 mg/dl.  The goal for HDL is higher than 40 mg/dl for men and higher than 50 mg/dl for women.  The goal for triglycerides is  less than 150 mg/dl. Flu vaccine, pneumonia vaccine, and hepatitis B vaccine  The flu vaccine is recommended yearly.  The pneumonia vaccine is generally given once in a lifetime. However, there are some instances where another vaccine is recommended. Check with your caregiver.  The hepatitis B vaccine is also recommended for adults with diabetes. Diabetes self-management education  Recommended at diagnosis and ongoing as needed. Treatment plan  Reviewed at every medical visit. Document Released: 02/21/2009 Document Revised: 07/19/2011 Document Reviewed: 10/27/2010 [facility] Patient Information 2013 [place], [place]. Diabetes Meal Planning Guide The diabetes meal planning guide is a tool to help you plan your meals and snacks. It is important for people with diabetes to manage their blood glucose (sugar) levels. Choosing the right foods and the right amounts throughout your day will help control your blood glucose. Eating right can even help you improve your blood pressure and reach or maintain a healthy weight. CARBOHYDRATE COUNTING MADE EASY When you eat carbohydrates, they turn to sugar. This raises your blood glucose level. Counting carbohydrates can help you control this level so you feel better. When you plan your meals by counting carbohydrates, you can have more flexibility in what you eat and balance your medicine with your food intake. Carbohydrate counting simply means adding up the total amount of carbohydrate grams in your meals and snacks. Try to eat about the same amount at each meal. Foods with carbohydrates are listed below. Each portion below is 1 carbohydrate serving or 15 grams of carbohydrates. Ask your dietician how many grams of carbohydrates you should eat at each meal or snack. Grains and Starches  1 slice bread.   English muffin or hotdog/hamburger bun.   cup cold cereal (unsweetened).   cup cooked pasta or rice.   cup starchy vegetables (corn, potatoes, peas,  beans, winter squash).  1 tortilla (6 inches).   bagel.  1 waffle or pancake (size of a CD).   cup cooked cereal.  4 to 6 small crackers. *Whole grain is recommended. Fruit  1 cup fresh unsweetened berries, melon, papaya, pineapple.  1 small fresh fruit.   banana or mango.   cup fruit juice (4 oz unsweetened).   cup canned fruit in natural juice or water.  2 tbs dried fruit.  12 to 15 grapes or cherries. Milk and Yogurt  1 cup fat-free or 1% milk.  1 cup soy milk.  6 oz light yogurt with sugar-free sweetener.  6 oz low-fat soy yogurt.  6 oz plain yogurt. Vegetables  1 cup raw or  cup cooked is counted as  0 carbohydrates or a "free" food.  If you eat 3 or more servings at 1 meal, count them as 1 carbohydrate serving. Other Carbohydrates   oz chips or pretzels.   cup ice cream or frozen yogurt.   cup sherbet or sorbet.  2 inch square cake, no frosting.  1 tbs honey, sugar, jam, jelly, or syrup.  2 small cookies.  3 squares of graham crackers.  3 cups popcorn.  6 crackers.  1 cup broth-based soup.  Count 1 cup casserole or other mixed foods as 2 carbohydrate servings.  Foods with less than 20 calories in a serving may be counted as 0 carbohydrates or a "free" food. You may want to purchase a book or computer software that lists the carbohydrate gram counts of different foods. In addition, the nutrition facts panel on the labels of the foods you eat are a good source of this information. The label will tell you how big the serving size is and the total number of carbohydrate grams you will be eating per serving. Divide this number by 15 to obtain the number of carbohydrate servings in a portion. Remember, 1 carbohydrate serving equals 15 grams of carbohydrate. SERVING SIZES Measuring foods and serving sizes helps you make sure you are getting the right amount of food. The list below tells how big or small some common serving sizes are.  1  oz.........4 stacked dice.  3 oz........[place] KitchenDeck of cards.  1 tsp.......[place] KitchenTip of little finger.  1 tbs......Marland KitchenMarland KitchenThumb.  2 tbs.......Marland KitchenGolf ball.   cup......Marland KitchenHalf of a fist.  1 cup.......Marland KitchenA fist. SAMPLE DIABETES MEAL PLAN Below is a sample meal plan that includes foods from the grain and starches, dairy, vegetable, fruit, and meat groups. A dietician can individualize a meal plan to fit your calorie needs and tell you the number of servings needed from each food group. However, controlling the total amount of carbohydrates in your meal or snack is more important than making sure you include all of the food groups at every meal. You may interchange carbohydrate containing foods (dairy, starches, and fruits). The meal plan below is an example of a 2000 calorie diet using carbohydrate counting. This meal plan has 17 carbohydrate servings. Breakfast  1 cup oatmeal (2 carb servings).   cup light yogurt (1 carb serving).  1 cup blueberries (1 carb serving).   cup almonds. Snack  1 large apple (2 carb servings).  1 low-fat string cheese stick. Lunch  Chicken breast salad.  1 cup spinach.   cup chopped tomatoes.  2 oz chicken breast, sliced.  2 tbs low-fat [place] dressing.  12 whole-wheat crackers (2 carb servings).  12 to 15 grapes (1 carb serving).  1 cup low-fat milk (1 carb serving). Snack  1 cup carrots.   cup hummus (1 carb serving). Dinner  3 oz broiled salmon.  1 cup brown rice (3 carb servings). Snack  1  cups steamed broccoli (1 carb serving) drizzled with 1 tsp olive oil and lemon juice.  1 cup light pudding (2 carb servings). DIABETES MEAL PLANNING WORKSHEET Your dietician can use this worksheet to help you decide how many servings of foods and what types of foods are right for you.  BREAKFAST Food Group and Servings / Carb Servings Grain/Starches __________________________________ Dairy __________________________________________ Vegetable  ______________________________________ Fruit ___________________________________________ Meat __________________________________________ Fat ____________________________________________ LUNCH Food Group and Servings / Carb Servings Grain/Starches ___________________________________ Dairy ___________________________________________ Fruit ____________________________________________ Meat ___________________________________________ Fat _____________________________________________ Laural [employer] Food Group and Servings / Carb Servings Grain/Starches ___________________________________ [employer]  ___________________________________________ Fruit ____________________________________________ Meat ___________________________________________ Fat _____________________________________________ SNACKS Food Group and Servings / Carb Servings Grain/Starches ___________________________________ Dairy ___________________________________________ Vegetable _______________________________________ Fruit ____________________________________________ Meat ___________________________________________ Fat _____________________________________________ DAILY TOTALS Starches _________________________ Vegetable ________________________ Fruit ____________________________ Dairy ____________________________ Meat ____________________________ Fat ______________________________ Document Released: 01/21/2005 Document Revised: 07/19/2011 Document Reviewed: 12/02/2008 [employer] Patient Information 2013 [place], [place].

## 2012-06-21 NOTE — Assessment & Plan Note (Signed)
Counseled pt about weight bearing exercise eRx for ergocalciferol weekly x 12 weeks

## 2012-06-21 NOTE — Progress Notes (Signed)
Subjective:    Patient ID: Sandra Petersen, female    DOB: 1950/08/12, 62 y.o.   MRN: [id number]  HPI  Pt presents to the clinic today to f/u on her lab work. Her labs from last week indicated that she is diabetic and also that her Vit D was low.   Review of Systems      Past Medical History  Diagnosis Date  . Chronic headaches     Current Outpatient Prescriptions  Medication Sig Dispense Refill  . ibuprofen (ADVIL,MOTRIN) 200 MG tablet Take 400 mg by mouth every 6 (six) hours as needed. For pain       No current facility-administered medications for this visit.    No Known Allergies  Family History  Problem Relation Age of Onset  . Diabetes Sister   . Hypertension Other     Parent    History   Social History  . Marital Status: Married    Spouse Name: N/A    Number of Children: N/A  . Years of Education: 12   Occupational History  . [employer]    Social History Main Topics  . Smoking status: Never Smoker   . Smokeless tobacco: Never Used  . Alcohol Use: No  . Drug Use: No  . Sexually Active: Yes    Birth Control/ Protection: Post-menopausal   Other Topics Concern  . Not on file   Social History Narrative   Regular exercise-yes   Caffeine Use-yes     Constitutional: Pt reports fatigue. Denies fever, malaise, headache or abrupt weight changes.  Respiratory: Denies difficulty breathing, shortness of breath, cough or sputum production.   Cardiovascular: Denies chest pain, chest tightness, palpitations or swelling in the hands or feet.  Gastrointestinal: Denies increased thirst, abdominal pain, bloating, constipation, diarrhea or blood in the stool.  GU: Denies urgency, frequency, pain with urination, burning sensation, blood in urine, odor or discharge. Neurological: Denies numbness or tingling in the hands or feet, dizziness, difficulty with memory, difficulty with speech or problems with balance and coordination.   No other specific complaints in a  complete review of systems (except as listed in HPI above).  Objective:   Physical Exam  BP 132/72  Pulse 64  Temp(Src) 98.1 F (36.7 C) (Oral)  Ht 5\' 2"  (1.575 m)  Wt 147 lb (66.679 kg)  BMI 26.88 kg/m2  SpO2 95% Wt Readings from Last 3 Encounters:  06/21/12 147 lb (66.679 kg)  06/16/12 146 lb 8 oz (66.452 kg)  05/13/12 135 lb (61.236 kg)    General: Appears her stated age, overweight but well developed, well nourished in NAD.  Cardiovascular: Normal rate and rhythm. S1,S2 noted.  No murmur, rubs or gallops noted. No JVD or BLE edema. No carotid bruits noted. Pulmonary/Chest: Normal effort and positive vesicular breath sounds. No respiratory distress. No wheezes, rales or ronchi noted.  Abdomen: Soft and nontender. Normal bowel sounds, no bruits noted. No distention or masses noted. Liver, spleen and kidneys non palpable. Neurological: Alert and oriented. Cranial nerves II-XII intact. Coordination normal. +DTRs bilaterally. Sensation intact to light and sharp touch.   BMET    Component Value Date/Time   NA 140 06/16/2012 1006   K 4.3 06/16/2012 1006   CL 104 06/16/2012 1006   CO2 29 06/16/2012 1006   GLUCOSE 117* 06/16/2012 1006   BUN 16 06/16/2012 1006   CREATININE 0.7 06/16/2012 1006   CALCIUM 9.7 06/16/2012 1006   GFRNONAA >60 04/02/2010 2115   GFRAA  Value: >60  The eGFR has been calculated using the MDRD equation. This calculation has not been validated in all clinical situations. eGFR's persistently <60 mL/min signify possible Chronic Kidney Disease. 04/02/2010 2115    Lipid Panel     Component Value Date/Time   CHOL 157 06/16/2012 1006   TRIG 154.0* 06/16/2012 1006   HDL 44.80 06/16/2012 1006   CHOLHDL 4 06/16/2012 1006   VLDL 30.8 06/16/2012 1006   LDLCALC 81 06/16/2012 1006    CBC    Component Value Date/Time   WBC 6.8 06/16/2012 1006   RBC 3.94 06/16/2012 1006   HGB 12.0 06/16/2012 1006   HCT 36.1 06/16/2012 1006   PLT 235.0 06/16/2012 1006   MCV 91.7 06/16/2012 1006   MCH 31.3  04/02/2010 2115   MCHC 33.2 06/16/2012 1006   RDW 12.9 06/16/2012 1006   LYMPHSABS 1.4 04/02/2010 2115   MONOABS 0.2 04/02/2010 2115   EOSABS 0.1 04/02/2010 2115   BASOSABS 0.0 04/02/2010 2115    Hgb A1C Lab Results  Component Value Date   HGBA1C 7.1* 06/16/2012         Assessment & Plan:   Greater than 50% of time spent counseling patient on diet, exercise, standards of medical therapy. Approx time spent with patient 35 minutes.  RTC in 3 months to recheck HgbA1C and Vit D level

## 2012-06-23 ENCOUNTER — Ambulatory Visit (HOSPITAL_COMMUNITY): Payer: 59

## 2012-06-26 ENCOUNTER — Telehealth: Payer: Self-pay | Admitting: Internal Medicine

## 2012-06-26 ENCOUNTER — Other Ambulatory Visit: Payer: Self-pay | Admitting: *Deleted

## 2012-06-26 MED ORDER — GLUCOSE BLOOD VI STRP: ORAL_STRIP | Status: DC

## 2012-06-26 MED ORDER — ONETOUCH DELICA LANCETS FINE MISC: 1.0000 | Freq: Two times a day (BID) | Status: DC

## 2012-06-26 NOTE — Telephone Encounter (Signed)
Rx for strips and lancets sent to [facility].

## 2012-06-26 NOTE — Telephone Encounter (Signed)
Pt's spouse called for refill of test strips and lancets for new meter. Rx's sent to pharmacy.

## 2012-06-26 NOTE — Telephone Encounter (Signed)
Ash, Can we call in strips and lancets for the one touch verio IQ machine [address]

## 2012-06-26 NOTE — Telephone Encounter (Signed)
[facility] Triage Call Report Triage Record Num: [id number] Operator: Cornell Barman Patient Name: Sandra Petersen Call Date & Time: 06/22/2012 8:01:45AM Patient Phone: [phone] PCP: Nicki Reaper Patient Gender: Female PCP Fax : Patient DOB: [birth year] [facility] Name: [facility] Reason for Call: Caller: Latiana/Patient; PCP: Nicki Reaper; CB#: [phone]; Call regarding ; Husband to the phone. He states they picked up glucose machine. It only came with 10 strips. They are checking blood sugar four times a day and need more strips. They have enough for today and 2 checks tomorrow. Reviewed EPIC. There is no information regarding make /model of One Touch. Husband states the Machine "One Touch- Verio LQ starter Kit"- Pharmacy- [facility] pyrmid [facility] [phone]. Advised due to weather they cannot get out. They have enough for today and 2 checks tomorrow will send message to the office requesting medication assistance. office hours provided . Understanding expressed. Protocol(s) Used: Office Note Recommended Outcome per Protocol: Information Noted and Sent to Office Reason for Outcome: Caller information to office

## 2012-06-28 ENCOUNTER — Ambulatory Visit (HOSPITAL_COMMUNITY): Payer: 59

## 2012-07-06 ENCOUNTER — Ambulatory Visit (HOSPITAL_COMMUNITY)
Admission: RE | Admit: 2012-07-06 | Discharge: 2012-07-06 | Disposition: A | Discharge status: Home / Self Care | Payer: 59 | Source: Ambulatory Visit | Attending: Internal Medicine | Admitting: Internal Medicine

## 2012-07-06 ENCOUNTER — Ambulatory Visit (HOSPITAL_COMMUNITY): Payer: 59

## 2012-07-06 DIAGNOSIS — Z1231 Encounter for screening mammogram for malignant neoplasm of breast: Secondary | ICD-10-CM | POA: Insufficient documentation

## 2012-07-06 DIAGNOSIS — Z1239 Encounter for other screening for malignant neoplasm of breast: Secondary | ICD-10-CM

## 2012-07-10 ENCOUNTER — Other Ambulatory Visit: Payer: Self-pay | Admitting: Internal Medicine

## 2012-07-10 DIAGNOSIS — R928 Other abnormal and inconclusive findings on diagnostic imaging of breast: Secondary | ICD-10-CM

## 2012-07-18 ENCOUNTER — Ambulatory Visit
Admission: RE | Admit: 2012-07-18 | Discharge: 2012-07-18 | Disposition: A | Discharge status: Home / Self Care | Payer: 59 | Source: Ambulatory Visit | Attending: Internal Medicine | Admitting: Internal Medicine

## 2012-07-18 DIAGNOSIS — R928 Other abnormal and inconclusive findings on diagnostic imaging of breast: Secondary | ICD-10-CM

## 2012-09-18 ENCOUNTER — Ambulatory Visit: Payer: 59 | Admitting: Internal Medicine

## 2012-09-18 DIAGNOSIS — Z0289 Encounter for other administrative examinations: Secondary | ICD-10-CM

## 2012-09-22 ENCOUNTER — Telehealth: Payer: Self-pay | Admitting: *Deleted

## 2012-09-22 NOTE — Telephone Encounter (Signed)
Left msg on triage needing to get copy of pt A1C & lipid results. Pls fax to [phone]. Faxed last lab report...lmb

## 2012-12-13 ENCOUNTER — Other Ambulatory Visit: Payer: Self-pay | Admitting: Internal Medicine

## 2012-12-13 DIAGNOSIS — R921 Mammographic calcification found on diagnostic imaging of breast: Secondary | ICD-10-CM

## 2013-01-17 ENCOUNTER — Ambulatory Visit
Admission: RE | Admit: 2013-01-17 | Discharge: 2013-01-17 | Disposition: A | Discharge status: Home / Self Care | Payer: 59 | Source: Ambulatory Visit | Attending: Internal Medicine | Admitting: Internal Medicine

## 2013-01-17 DIAGNOSIS — R921 Mammographic calcification found on diagnostic imaging of breast: Secondary | ICD-10-CM

## 2013-03-01 ENCOUNTER — Ambulatory Visit: Payer: 59 | Admitting: Internal Medicine

## 2013-03-30 ENCOUNTER — Emergency Department (HOSPITAL_COMMUNITY)
Admission: EM | Admit: 2013-03-30 | Discharge: 2013-03-30 | Disposition: A | Discharge status: Home / Self Care | Payer: 59 | Attending: Emergency Medicine | Admitting: Emergency Medicine

## 2013-03-30 ENCOUNTER — Emergency Department (HOSPITAL_COMMUNITY): Payer: 59

## 2013-03-30 ENCOUNTER — Encounter (HOSPITAL_COMMUNITY): Payer: Self-pay | Admitting: Emergency Medicine

## 2013-03-30 DIAGNOSIS — R509 Fever, unspecified: Secondary | ICD-10-CM | POA: Insufficient documentation

## 2013-03-30 DIAGNOSIS — R059 Cough, unspecified: Secondary | ICD-10-CM | POA: Insufficient documentation

## 2013-03-30 DIAGNOSIS — E119 Type 2 diabetes mellitus without complications: Secondary | ICD-10-CM | POA: Insufficient documentation

## 2013-03-30 DIAGNOSIS — R0989 Other specified symptoms and signs involving the circulatory and respiratory systems: Secondary | ICD-10-CM | POA: Insufficient documentation

## 2013-03-30 DIAGNOSIS — J3489 Other specified disorders of nose and nasal sinuses: Secondary | ICD-10-CM | POA: Insufficient documentation

## 2013-03-30 DIAGNOSIS — N390 Urinary tract infection, site not specified: Secondary | ICD-10-CM

## 2013-03-30 DIAGNOSIS — R51 Headache: Secondary | ICD-10-CM | POA: Insufficient documentation

## 2013-03-30 DIAGNOSIS — R05 Cough: Secondary | ICD-10-CM

## 2013-03-30 HISTORY — DX: Type 2 diabetes mellitus without complications: E11.9

## 2013-03-30 LAB — COMPREHENSIVE METABOLIC PANEL
ALT: 15 U/L (ref 0–35)
Albumin: 4.2 g/dL (ref 3.5–5.2)
BUN: 10 mg/dL (ref 6–23)
Calcium: 9.9 mg/dL (ref 8.4–10.5)
Chloride: 97 mEq/L (ref 96–112)
Creatinine, Ser: 0.72 mg/dL (ref 0.50–1.10)
Glucose, Bld: 136 mg/dL — ABNORMAL HIGH (ref 70–99)
Total Bilirubin: 0.8 mg/dL (ref 0.3–1.2)

## 2013-03-30 LAB — URINALYSIS, ROUTINE W REFLEX MICROSCOPIC
Nitrite: NEGATIVE
Protein, ur: 30 mg/dL — AB
Specific Gravity, Urine: 1.024 (ref 1.005–1.030)
Urobilinogen, UA: 1 mg/dL (ref 0.0–1.0)
pH: 7 (ref 5.0–8.0)

## 2013-03-30 LAB — CBC WITH DIFFERENTIAL/PLATELET
Basophils Absolute: 0 10*3/uL (ref 0.0–0.1)
Basophils Relative: 0 % (ref 0–1)
Eosinophils Absolute: 0.1 10*3/uL (ref 0.0–0.7)
Eosinophils Relative: 1 % (ref 0–5)
HCT: 35.7 % — ABNORMAL LOW (ref 36.0–46.0)
Hemoglobin: 11.8 g/dL — ABNORMAL LOW (ref 12.0–15.0)
Lymphs Abs: 2.3 10*3/uL (ref 0.7–4.0)
MCH: 30.5 pg (ref 26.0–34.0)
MCHC: 33.1 g/dL (ref 30.0–36.0)
MCV: 92.2 fL (ref 78.0–100.0)
Monocytes Absolute: 1.5 10*3/uL — ABNORMAL HIGH (ref 0.1–1.0)
Monocytes Relative: 10 % (ref 3–12)
Neutro Abs: 11.7 10*3/uL — ABNORMAL HIGH (ref 1.7–7.7)
RDW: 12.7 % (ref 11.5–15.5)

## 2013-03-30 LAB — URINE MICROSCOPIC-ADD ON

## 2013-03-30 MED ORDER — ACETAMINOPHEN 325 MG PO TABS
650.0000 mg | ORAL_TABLET | Freq: Four times a day (QID) | ORAL | Status: DC | PRN
  Administered 2013-03-30: 650 mg via ORAL
  Filled 2013-03-30: qty 2

## 2013-03-30 MED ORDER — LEVOFLOXACIN 750 MG PO TABS
750.0000 mg | ORAL_TABLET | Freq: Once | ORAL | Status: AC
  Administered 2013-03-30: 750 mg via ORAL
  Filled 2013-03-30 (×2): qty 2
  Filled 2013-03-30: qty 1

## 2013-03-30 MED ORDER — LEVOFLOXACIN 500 MG PO TABS: 500.0000 mg | ORAL_TABLET | Freq: Every day | ORAL | Status: DC

## 2013-03-30 NOTE — ED Notes (Signed)
Pt states that she has fever, cough, body aches and headache that started last night. Pt denies taking anything for body aches or fever. Pt BP elevated as well as HR

## 2013-03-30 NOTE — ED Notes (Signed)
MD at bedside. 

## 2013-03-30 NOTE — Discharge Instructions (Signed)
Rest. Drink plenty of fluids.  Take antibiotic as prescribed. Take tylenol/advil as need. Try mucinex or robitussin as need for cough.  Follow up with primary care doctor in 1 week if symptoms fail to improve/resolve. Return to ER if worse, trouble breathing, other concern.

## 2013-03-30 NOTE — ED Provider Notes (Signed)
CSN: [id number]     Arrival date & time 03/30/13  1739 History   First MD Initiated Contact with Patient 03/30/13 1846     Chief Complaint  Patient presents with  . Fever  . Cough  . Headache   (Consider location/radiation/quality/duration/timing/severity/associated sxs/prior Treatment) Patient is a 62 y.o. female presenting with fever and cough. The history is provided by the patient.  Fever Associated symptoms: cough and rhinorrhea   Associated symptoms: no chest pain, no chills, no confusion, no dysuria, no headaches, no rash and no vomiting   Cough Associated symptoms: fever and rhinorrhea   Associated symptoms: no chest pain, no chills, no headaches, no rash and no shortness of breath   pt c/o non productive cough, fever, achy for the past couple days. 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 Had scratchy throat but that has improved. Mild runny nose. No sinus pressure. No severe headaches or neck stiffness. No cp or sob. No abd pain or nvd. No gu c/o. No rash.       Past Medical History  Diagnosis Date  . Chronic headaches   . Diabetes mellitus without complication    History reviewed. No pertinent past surgical history. Family History  Problem Relation Age of Onset  . Diabetes Sister   . Hypertension Other     Parent   History  Substance Use Topics  . Smoking status: Never Smoker   . Smokeless tobacco: Never Used  . Alcohol Use: No   OB History   Grav Para Term Preterm Abortions TAB SAB Ect Mult Living                 Review of Systems  Constitutional: Positive for fever. Negative for chills.  HENT: Positive for rhinorrhea. Negative for trouble swallowing.   Eyes: Negative for redness.  Respiratory: Positive for cough. Negative for shortness of breath.   Cardiovascular: Negative for chest pain.  Gastrointestinal: Negative for vomiting and abdominal pain.  Genitourinary: Negative for dysuria and flank pain.  Musculoskeletal:  Negative for back pain and neck pain.  Skin: Negative for rash.  Neurological: Negative for headaches.  Hematological: Does not bruise/bleed easily.  Psychiatric/Behavioral: Negative for confusion.    Allergies  Review of patient's allergies indicates no known allergies.  Home Medications   Current Outpatient Rx  Name  Route  Sig  Dispense  Refill  . acetaminophen (TYLENOL) 325 MG tablet   Oral   Take 650 mg by mouth every 6 (six) hours as needed (pain).          BP 146/123  Pulse 110  Temp(Src) 102.5 F (39.2 C) (Oral)  Resp 22  SpO2 96% Physical Exam  Nursing note and vitals reviewed. Constitutional: She is oriented to person, place, and time. She appears well-developed and well-nourished. No distress.  HENT:  Mouth/Throat: Oropharynx is clear and moist.  Rhinorrhea.   Eyes: Conjunctivae are normal. No scleral icterus.  Neck: Normal range of motion. Neck supple. No tracheal deviation present.  No stiffness or rigidity  Cardiovascular: Regular rhythm, normal heart sounds and intact distal pulses.  Exam reveals no gallop and no friction rub.   No murmur heard. Pulmonary/Chest: Effort normal and breath sounds normal. No respiratory distress.  Rhonchi. Non prod cough. Upper resp congestion.  Abdominal: Soft. Normal appearance. She exhibits no distension. There is no tenderness.  Genitourinary:  No cva tenderness  Musculoskeletal: She exhibits no edema and no tenderness.  Lymphadenopathy:  She has no cervical adenopathy.  Neurological: She is alert and oriented to person, place, and time.  Skin: Skin is warm and dry. No rash noted. She is not diaphoretic.  Psychiatric: She has a normal mood and affect.    ED Course  Procedures (including critical care time)  EKG Interpretation   None      Results for orders placed during the hospital encounter of 03/30/13  CBC WITH DIFFERENTIAL      Result Value Range   WBC 15.6 (*) 4.0 - 10.5 K/uL   RBC 3.87  3.87 -  5.11 MIL/uL   Hemoglobin 11.8 (*) 12.0 - 15.0 g/dL   HCT 16.1 (*) 09.6 - 04.5 %   MCV 92.2  78.0 - 100.0 fL   MCH 30.5  26.0 - 34.0 pg   MCHC 33.1  30.0 - 36.0 g/dL   RDW 40.9  81.1 - 91.4 %   Platelets 259  150 - 400 K/uL   Neutrophils Relative % 75  43 - 77 %   Neutro Abs 11.7 (*) 1.7 - 7.7 K/uL   Lymphocytes Relative 15  12 - 46 %   Lymphs Abs 2.3  0.7 - 4.0 K/uL   Monocytes Relative 10  3 - 12 %   Monocytes Absolute 1.5 (*) 0.1 - 1.0 K/uL   Eosinophils Relative 1  0 - 5 %   Eosinophils Absolute 0.1  0.0 - 0.7 K/uL   Basophils Relative 0  0 - 1 %   Basophils Absolute 0.0  0.0 - 0.1 K/uL  COMPREHENSIVE METABOLIC PANEL      Result Value Range   Sodium 136  135 - 145 mEq/L   Potassium 3.4 (*) 3.5 - 5.1 mEq/L   Chloride 97  96 - 112 mEq/L   CO2 26  19 - 32 mEq/L   Glucose, Bld 136 (*) 70 - 99 mg/dL   BUN 10  6 - 23 mg/dL   Creatinine, Ser 7.82  0.50 - 1.10 mg/dL   Calcium 9.9  8.4 - 95.6 mg/dL   Total Protein 8.5 (*) 6.0 - 8.3 g/dL   Albumin 4.2  3.5 - 5.2 g/dL   AST 16  0 - 37 U/L   ALT 15  0 - 35 U/L   Alkaline Phosphatase 94  39 - 117 U/L   Total Bilirubin 0.8  0.3 - 1.2 mg/dL   GFR calc non Af Amer 89 (*) >90 mL/min   GFR calc Af Amer >90  >90 mL/min  URINALYSIS, ROUTINE W REFLEX MICROSCOPIC      Result Value Range   Color, Urine YELLOW  YELLOW   APPearance CLOUDY (*) CLEAR   Specific Gravity, Urine 1.024  1.005 - 1.030   pH 7.0  5.0 - 8.0   Glucose, UA NEGATIVE  NEGATIVE mg/dL   Hgb urine dipstick MODERATE (*) NEGATIVE   Bilirubin Urine NEGATIVE  NEGATIVE   Ketones, ur NEGATIVE  NEGATIVE mg/dL   Protein, ur 30 (*) NEGATIVE mg/dL   Urobilinogen, UA 1.0  0.0 - 1.0 mg/dL   Nitrite NEGATIVE  NEGATIVE   Leukocytes, UA MODERATE (*) NEGATIVE  URINE MICROSCOPIC-ADD ON      Result Value Range   Squamous Epithelial / LPF MANY (*) RARE   WBC, UA 3-6  <3 WBC/hpf   RBC / HPF 11-20  <3 RBC/hpf   Bacteria, UA MANY (*) RARE  CG4 I-STAT (LACTIC ACID)      Result Value  Range   Lactic  Acid, Venous 1.09  0.5 - 2.2 mmol/L   Dg Chest Port 1 View  03/30/2013   CLINICAL DATA:  Cough  EXAM: PORTABLE CHEST - 1 VIEW  COMPARISON:  04/02/2010  FINDINGS: The heart size and mediastinal contours are within normal limits. Both lungs are clear. The visualized skeletal structures are unremarkable.  IMPRESSION: No active disease.   Electronically Signed   By: Esperanza Heir M.D.   On: 03/30/2013 18:45     MDM  Cxr. Labs.  Reviewed nursing notes and prior charts for additional history.   Pt with cloudy urine, mod le, many bact - will cx and rx.   Given cough, resp exam, will also cover for possible early pna.    levaquin po.   Po fluids.    Suzi Roots, MD 03/30/13 2040

## 2013-04-01 LAB — URINE CULTURE: Colony Count: 90000

## 2013-04-05 LAB — CULTURE, BLOOD (ROUTINE X 2): Culture: NO GROWTH

## 2013-05-09 ENCOUNTER — Encounter (HOSPITAL_COMMUNITY): Payer: Self-pay | Admitting: Emergency Medicine

## 2013-05-09 ENCOUNTER — Emergency Department (HOSPITAL_COMMUNITY): Payer: 59

## 2013-05-09 ENCOUNTER — Emergency Department (HOSPITAL_COMMUNITY)
Admission: EM | Admit: 2013-05-09 | Discharge: 2013-05-09 | Disposition: A | Discharge status: Home / Self Care | Payer: 59 | Attending: Emergency Medicine | Admitting: Emergency Medicine

## 2013-05-09 DIAGNOSIS — G8929 Other chronic pain: Secondary | ICD-10-CM | POA: Insufficient documentation

## 2013-05-09 DIAGNOSIS — S4980XA Other specified injuries of shoulder and upper arm, unspecified arm, initial encounter: Secondary | ICD-10-CM | POA: Insufficient documentation

## 2013-05-09 DIAGNOSIS — E119 Type 2 diabetes mellitus without complications: Secondary | ICD-10-CM | POA: Insufficient documentation

## 2013-05-09 DIAGNOSIS — Z79899 Other long term (current) drug therapy: Secondary | ICD-10-CM | POA: Insufficient documentation

## 2013-05-09 DIAGNOSIS — Y92410 Unspecified street and highway as the place of occurrence of the external cause: Secondary | ICD-10-CM | POA: Insufficient documentation

## 2013-05-09 DIAGNOSIS — S298XXA Other specified injuries of thorax, initial encounter: Secondary | ICD-10-CM | POA: Insufficient documentation

## 2013-05-09 DIAGNOSIS — Y9389 Activity, other specified: Secondary | ICD-10-CM | POA: Insufficient documentation

## 2013-05-09 DIAGNOSIS — S46909A Unspecified injury of unspecified muscle, fascia and tendon at shoulder and upper arm level, unspecified arm, initial encounter: Secondary | ICD-10-CM | POA: Insufficient documentation

## 2013-05-09 MED ORDER — METHOCARBAMOL 500 MG PO TABS
500.0000 mg | ORAL_TABLET | Freq: Once | ORAL | Status: AC
  Administered 2013-05-09: 500 mg via ORAL
  Filled 2013-05-09: qty 1

## 2013-05-09 MED ORDER — HYDROCODONE-ACETAMINOPHEN 5-325 MG PO TABS: 1.0000 | ORAL_TABLET | Freq: Four times a day (QID) | ORAL | Status: DC | PRN

## 2013-05-09 MED ORDER — METHOCARBAMOL 500 MG PO TABS: 500.0000 mg | ORAL_TABLET | Freq: Two times a day (BID) | ORAL | Status: DC

## 2013-05-09 MED ORDER — OXYCODONE-ACETAMINOPHEN 5-325 MG PO TABS
2.0000 | ORAL_TABLET | Freq: Once | ORAL | Status: AC
  Administered 2013-05-09: 2 via ORAL
  Filled 2013-05-09: qty 2

## 2013-05-09 MED ORDER — NAPROXEN 500 MG PO TABS: 500.0000 mg | ORAL_TABLET | Freq: Two times a day (BID) | ORAL | Status: DC
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
## 2013-05-09 NOTE — ED Provider Notes (Signed)
Medical screening examination/treatment/procedure(s) were performed by non-physician practitioner and as supervising physician I was immediately available for consultation/collaboration.  EKG Interpretation   None         Rolan Bucco, MD 05/09/13 [phone]

## 2013-07-13 ENCOUNTER — Ambulatory Visit: Payer: 59 | Admitting: Physician Assistant

## 2013-07-23 ENCOUNTER — Encounter: Payer: Self-pay | Admitting: Physician Assistant

## 2013-07-23 ENCOUNTER — Ambulatory Visit (INDEPENDENT_AMBULATORY_CARE_PROVIDER_SITE_OTHER): Payer: 59 | Admitting: Physician Assistant

## 2013-07-23 VITALS — BP 140/82 | HR 66 | Temp 98.6°F | Wt 142.6 lb

## 2013-07-23 DIAGNOSIS — M79609 Pain in unspecified limb: Secondary | ICD-10-CM

## 2013-07-23 DIAGNOSIS — M79671 Pain in right foot: Secondary | ICD-10-CM

## 2013-07-23 DIAGNOSIS — E119 Type 2 diabetes mellitus without complications: Secondary | ICD-10-CM

## 2013-07-23 DIAGNOSIS — M79672 Pain in left foot: Secondary | ICD-10-CM

## 2013-07-23 DIAGNOSIS — M79601 Pain in right arm: Secondary | ICD-10-CM

## 2013-07-23 DIAGNOSIS — Z8639 Personal history of other endocrine, nutritional and metabolic disease: Secondary | ICD-10-CM

## 2013-07-23 MED ORDER — METFORMIN HCL 500 MG PO TABS: 500.0000 mg | ORAL_TABLET | Freq: Two times a day (BID) | ORAL | Status: DC

## 2013-07-23 NOTE — Progress Notes (Signed)
Subjective:    Patient ID: Sandra Petersen, female    DOB: [birth year], 63 y.o.   MRN: [id number]  HPI Comments: Also complains of bilateral foot pain. Reports pain mostly while at work and up and walking around. Pain is relieved when off work and not bearing weight. Describes pain as a burning sensation of the balls of both feet. Has used Ibuprofen intermittently for pain with little relief. Reports wears supportive shoes. Denies injury or trauma to either foot. Denies numbness or tingling to either foot.   Arm Pain  The incident occurred 5 to 7 days ago. Incident location: no specific injury to arm,  There was no injury mechanism. The pain is present in the upper right arm (just above elbow of dominant right arm). The quality of the pain is described as aching. The pain does not radiate. The pain is moderate. The pain has been constant since the incident. Pertinent negatives include no chest pain, muscle weakness, numbness or tingling. The symptoms are aggravated by movement. She has tried NSAIDs (has tried Ibuprofen off and on) for the symptoms. The treatment provided no relief.      Review of Systems  Cardiovascular: Negative for chest pain.  Neurological: Negative for tingling and numbness.   Past Medical History  Diagnosis Date  . Chronic headaches   . Diabetes mellitus without complication        Objective:   Physical Exam  Vitals reviewed. Constitutional: She is oriented to person, place, and time. She appears well-developed and well-nourished. No distress.  HENT:  Head: Normocephalic and atraumatic.  Right Ear: External ear normal.  Left Ear: External ear normal.  Eyes: Conjunctivae are normal.  Neck: Normal range of motion.  Cardiovascular: Normal rate, regular rhythm and intact distal pulses.  Exam reveals no gallop and no friction rub.   No murmur heard. Pulses:      Radial pulses are 2+ on the right side, and 2+ on the left side.       Dorsalis pedis pulses are  2+ on the right side, and 2+ on the left side.       Posterior tibial pulses are 2+ on the right side, and 2+ on the left side.  Pulmonary/Chest: Effort normal and breath sounds normal. She has no wheezes. She has no rales.  Musculoskeletal: Normal range of motion.  FROM of RUE. Good muscle strength. Mild pain to palpation of supinator muscle, remainder of arm non tender. Area is without erythema, edema, tactile heat, mass or spasm. DNVI  Foot exam - bilateral normal; no swelling, tenderness or skin or vascular lesions. Color and temperature is normal. Sensation is intact. Peripheral pulses are palpable. Toenails are normal.    Neurological: She is alert and oriented to person, place, and time.  Skin: Skin is warm and dry. She is not diaphoretic.  Psychiatric: She has a normal mood and affect.   Lab Results  Component Value Date   WBC 15.6* 03/30/2013   HGB 11.8* 03/30/2013   HCT 35.7* 03/30/2013   PLT 259 03/30/2013   GLUCOSE 136* 03/30/2013   CHOL 157 06/16/2012   TRIG 154.0* 06/16/2012   HDL 44.80 06/16/2012   LDLCALC 81 06/16/2012   ALT 15 03/30/2013   AST 16 03/30/2013   NA 136 03/30/2013   K 3.4* 03/30/2013   CL 97 03/30/2013   CREATININE 0.72 03/30/2013   BUN 10 03/30/2013   CO2 26 03/30/2013   TSH 1.72 06/16/2012   HGBA1C 7.1* 06/16/2012  Filed Vitals:   07/23/13 0945  BP: 140/82  Pulse: 66  Temp: 98.6 F (37 C)   Wt Readings from Last 3 Encounters:  07/23/13 142 lb 9.6 oz (64.683 kg)  06/21/12 147 lb (66.679 kg)  06/16/12 146 lb 8 oz (66.452 kg)        Assessment & Plan:  Labs ordered today, has not had labs for over one year.   DM Refill of metformin Hgb A1C ordered today Diabetic foot exam today  Foot pain Counseled patient to be certain to wear good supportive shoes while bearing weight.  Counseled on regular Ibuprofen use for the next week, 600 mg tid. Ice feet at night using frozen vegetables or cold soda can to roll foot on. If no improvement consider  referral to Dr. Katrinka Blazing for evaluation  Arm pain Counseled to try regular Ibuprofen use for next week, 600 mg tid Counseled to purchase compression arm sleeve for symptom relief.  Vitamin D deficiency, history of  Patient to RTO in one month for complete physical exam.

## 2013-07-23 NOTE — Progress Notes (Signed)
Pre visit review using our clinic review tool, if applicable. No additional management support is needed unless otherwise documented below in the visit note. 

## 2013-07-23 NOTE — Patient Instructions (Addendum)
It was great to meet you today Sandra Petersen!   Labs have been ordered for you, when you report to lab please be fasting.    Arm pain, I recommend the Ibuprofen 600 mg three times daily for the next 5-7 days. Also recommend an elbow compression sleeve to be worn while at work.  If no improvement may have you follow up with Dr. Katrinka BlazingSmith, sports medicine.  Foot pain, the Ibuprofen should help with that as well. Can ice feet in evening after getting home from work, using a bag of frozen vegetables or a cold soda can and roll foot across it.  Diabetes: refills have been sent to your pharmacy, please take as directed. Will check your Hgb A1c today.  Please return to office in one month for complete physical exam.     Diabetes and Foot Care Diabetes may cause you to have problems because of poor blood supply (circulation) to your feet and legs. This may cause the skin on your feet to become thinner, break easier, and heal more slowly. Your skin may become dry, and the skin may peel and crack. You may also have nerve damage in your legs and feet causing decreased feeling in them. You may not notice minor injuries to your feet that could lead to infections or more serious problems. Taking care of your feet is one of the most important things you can do for yourself.  HOME CARE INSTRUCTIONS  Wear shoes at all times, even in the house. Do not go barefoot. Bare feet are easily injured.  Check your feet daily for blisters, cuts, and redness. If you cannot see the bottom of your feet, use a mirror or ask someone for help.  Wash your feet with warm water (do not use hot water) and mild soap. Then pat your feet and the areas between your toes until they are completely dry. Do not soak your feet as this can dry your skin.  Apply a moisturizing lotion or petroleum jelly (that does not contain alcohol and is unscented) to the skin on your feet and to dry, brittle toenails. Do not apply lotion between your  toes.  Trim your toenails straight across. Do not dig under them or around the cuticle. File the edges of your nails with an emery board or nail file.  Do not cut corns or calluses or try to remove them with medicine.  Wear clean socks or stockings every day. Make sure they are not too tight. Do not wear knee-high stockings since they may decrease blood flow to your legs.  Wear shoes that fit properly and have enough cushioning. To break in new shoes, wear them for just a few hours a day. This prevents you from injuring your feet. Always look in your shoes before you put them on to be sure there are no objects inside.  Do not cross your legs. This may decrease the blood flow to your feet.  If you find a minor scrape, cut, or break in the skin on your feet, keep it and the skin around it clean and dry. These areas may be cleansed with mild soap and water. Do not cleanse the area with peroxide, alcohol, or iodine.  When you remove an adhesive bandage, be sure not to damage the skin around it.  If you have a wound, look at it several times a day to make sure it is healing.  Do not use heating pads or hot water bottles. They may burn your  skin. If you have lost feeling in your feet or legs, you may not know it is happening until it is too late.  Make sure your health care provider performs a complete foot exam at least annually or more often if you have foot problems. Report any cuts, sores, or bruises to your health care provider immediately. SEEK MEDICAL CARE IF:   You have an injury that is not healing.  You have cuts or breaks in the skin.  You have an ingrown nail.  You notice redness on your legs or feet.  You feel burning or tingling in your legs or feet.  You have pain or cramps in your legs and feet.  Your legs or feet are numb.  Your feet always feel cold. SEEK IMMEDIATE MEDICAL CARE IF:   There is increasing redness, swelling, or pain in or around a wound.  There is a  red line that goes up your leg.  Pus is coming from a wound.  You develop a fever or as directed by your health care provider.  You notice a bad smell coming from an ulcer or wound. Document Released: 04/23/2000 Document Revised: 12/27/2012 Document Reviewed: 10/03/2012 [facility] Patient Information 2014 [place], [place].

## 2013-07-25 ENCOUNTER — Other Ambulatory Visit (INDEPENDENT_AMBULATORY_CARE_PROVIDER_SITE_OTHER): Payer: 59

## 2013-07-25 DIAGNOSIS — M79672 Pain in left foot: Secondary | ICD-10-CM

## 2013-07-25 DIAGNOSIS — M79601 Pain in right arm: Secondary | ICD-10-CM

## 2013-07-25 DIAGNOSIS — M79609 Pain in unspecified limb: Secondary | ICD-10-CM

## 2013-07-25 DIAGNOSIS — M79671 Pain in right foot: Secondary | ICD-10-CM

## 2013-07-25 DIAGNOSIS — E119 Type 2 diabetes mellitus without complications: Secondary | ICD-10-CM

## 2013-07-25 DIAGNOSIS — Z8639 Personal history of other endocrine, nutritional and metabolic disease: Secondary | ICD-10-CM

## 2013-07-25 LAB — URINALYSIS, ROUTINE W REFLEX MICROSCOPIC
BILIRUBIN URINE: NEGATIVE
KETONES UR: NEGATIVE
LEUKOCYTES UA: NEGATIVE
Nitrite: NEGATIVE
SPECIFIC GRAVITY, URINE: 1.02 (ref 1.000–1.030)
Total Protein, Urine: NEGATIVE
URINE GLUCOSE: NEGATIVE
Urobilinogen, UA: 0.2 (ref 0.0–1.0)
pH: 6 (ref 5.0–8.0)

## 2013-07-25 LAB — HEPATIC FUNCTION PANEL
ALBUMIN: 4.4 g/dL (ref 3.5–5.2)
ALK PHOS: 83 U/L (ref 39–117)
ALT: 18 U/L (ref 0–35)
AST: 18 U/L (ref 0–37)
Bilirubin, Direct: 0.1 mg/dL (ref 0.0–0.3)
TOTAL PROTEIN: 8.1 g/dL (ref 6.0–8.3)
Total Bilirubin: 0.9 mg/dL (ref 0.3–1.2)

## 2013-07-25 LAB — LIPID PANEL
CHOL/HDL RATIO: 3
Cholesterol: 149 mg/dL (ref 0–200)
HDL: 50.7 mg/dL (ref >39.00)
LDL Cholesterol: 88 mg/dL (ref 0–99)
TRIGLYCERIDES: 53 mg/dL (ref 0.0–149.0)
VLDL: 10.6 mg/dL (ref 0.0–40.0)

## 2013-07-25 LAB — CBC WITH DIFFERENTIAL/PLATELET
BASOS ABS: 0.1 10*3/uL (ref 0.0–0.1)
BASOS PCT: 0.6 % (ref 0.0–3.0)
EOS ABS: 0.3 10*3/uL (ref 0.0–0.7)
Eosinophils Relative: 3.3 % (ref 0.0–5.0)
HCT: 37.3 % (ref 36.0–46.0)
HEMOGLOBIN: 12.3 g/dL (ref 12.0–15.0)
LYMPHS ABS: 3 10*3/uL (ref 0.7–4.0)
LYMPHS PCT: 37.7 % (ref 12.0–46.0)
MCHC: 32.9 g/dL (ref 30.0–36.0)
MCV: 91.3 fl (ref 78.0–100.0)
Monocytes Absolute: 0.6 10*3/uL (ref 0.1–1.0)
Monocytes Relative: 7.1 % (ref 3.0–12.0)
NEUTROS ABS: 4 10*3/uL (ref 1.4–7.7)
Neutrophils Relative %: 51.3 % (ref 43.0–77.0)
Platelets: 245 10*3/uL (ref 150.0–400.0)
RBC: 4.09 Mil/uL (ref 3.87–5.11)
RDW: 13.4 % (ref 11.5–14.6)
WBC: 7.9 10*3/uL (ref 4.5–10.5)

## 2013-07-25 LAB — HEMOGLOBIN A1C: HEMOGLOBIN A1C: 7.1 % — AB (ref 4.6–6.5)

## 2013-07-25 LAB — BASIC METABOLIC PANEL
BUN: 16 mg/dL (ref 6–23)
CALCIUM: 9.6 mg/dL (ref 8.4–10.5)
CO2: 30 mEq/L (ref 19–32)
Chloride: 105 mEq/L (ref 96–112)
Creatinine, Ser: 0.7 mg/dL (ref 0.4–1.2)
GFR: 103.44 mL/min (ref >60.00)
Glucose, Bld: 103 mg/dL — ABNORMAL HIGH (ref 70–99)
Potassium: 4.1 mEq/L (ref 3.5–5.1)
SODIUM: 139 meq/L (ref 135–145)

## 2013-07-25 LAB — MICROALBUMIN / CREATININE URINE RATIO
Creatinine,U: 145.2 mg/dL
MICROALB/CREAT RATIO: 0.8 mg/g (ref 0.0–30.0)
Microalb, Ur: 1.2 mg/dL (ref 0.0–1.9)

## 2013-07-25 LAB — TSH: TSH: 2.05 u[IU]/mL (ref 0.35–5.50)

## 2013-07-26 ENCOUNTER — Other Ambulatory Visit: Payer: Self-pay | Admitting: Physician Assistant

## 2013-07-26 DIAGNOSIS — E559 Vitamin D deficiency, unspecified: Secondary | ICD-10-CM

## 2013-07-26 LAB — VITAMIN D 25 HYDROXY (VIT D DEFICIENCY, FRACTURES): Vit D, 25-Hydroxy: 14 ng/mL — ABNORMAL LOW (ref 30–89)

## 2013-07-26 MED ORDER — VITAMIN D (ERGOCALCIFEROL) 1.25 MG (50000 UNIT) PO CAPS: 50000.0000 [IU] | ORAL_CAPSULE | ORAL | Status: DC

## 2013-08-15 ENCOUNTER — Ambulatory Visit (HOSPITAL_COMMUNITY)
Admission: RE | Admit: 2013-08-15 | Discharge: 2013-08-15 | Disposition: A | Discharge status: Home / Self Care | Payer: 59 | Source: Ambulatory Visit | Attending: Physician Assistant | Admitting: Physician Assistant

## 2013-08-15 DIAGNOSIS — Z780 Asymptomatic menopausal state: Secondary | ICD-10-CM | POA: Insufficient documentation

## 2013-08-15 DIAGNOSIS — E559 Vitamin D deficiency, unspecified: Secondary | ICD-10-CM

## 2013-08-15 DIAGNOSIS — Z13820 Encounter for screening for osteoporosis: Secondary | ICD-10-CM | POA: Insufficient documentation

## 2013-08-22 ENCOUNTER — Encounter: Payer: 59 | Admitting: Physician Assistant

## 2013-09-05 ENCOUNTER — Encounter: Payer: Self-pay | Admitting: Internal Medicine

## 2013-09-05 ENCOUNTER — Ambulatory Visit (INDEPENDENT_AMBULATORY_CARE_PROVIDER_SITE_OTHER): Payer: 59 | Admitting: Internal Medicine

## 2013-09-05 VITALS — BP 160/88 | HR 80 | Temp 98.9°F | Resp 16 | Ht 62.0 in | Wt 138.0 lb

## 2013-09-05 DIAGNOSIS — E119 Type 2 diabetes mellitus without complications: Secondary | ICD-10-CM

## 2013-09-05 DIAGNOSIS — IMO0001 Reserved for inherently not codable concepts without codable children: Secondary | ICD-10-CM

## 2013-09-05 DIAGNOSIS — I10 Essential (primary) hypertension: Secondary | ICD-10-CM | POA: Insufficient documentation

## 2013-09-05 DIAGNOSIS — Z0000 Encounter for general adult medical examination without abnormal findings: Secondary | ICD-10-CM

## 2013-09-05 DIAGNOSIS — M7520 Bicipital tendinitis, unspecified shoulder: Secondary | ICD-10-CM

## 2013-09-05 DIAGNOSIS — R030 Elevated blood-pressure reading, without diagnosis of hypertension: Secondary | ICD-10-CM

## 2013-09-05 DIAGNOSIS — M7521 Bicipital tendinitis, right shoulder: Secondary | ICD-10-CM

## 2013-09-05 MED ORDER — VITAMIN D 1000 UNITS PO TABS: 1000.0000 [IU] | ORAL_TABLET | Freq: Every day | ORAL | Status: AC

## 2013-09-05 NOTE — Assessment & Plan Note (Signed)
Cont w/diet/Rx RTC 4 mo

## 2013-09-05 NOTE — Patient Instructions (Signed)
Biceps elastic sleeve

## 2013-09-05 NOTE — Assessment & Plan Note (Signed)
We discussed age appropriate health related issues, including available/recomended screening tests and vaccinations. We discussed a need for adhering to healthy diet and exercise. Labs/EKG were reviewed/ordered. All questions were answered.  Refused shots, colonoscopy Ophth, mammo q 12 mo  Pt agreed to have a Cologuard test

## 2013-09-05 NOTE — Progress Notes (Signed)
Pre visit review using our clinic review tool, if applicable. No additional management support is needed unless otherwise documented below in the visit note. 

## 2013-09-05 NOTE — Assessment & Plan Note (Signed)
Elastic sleeve Sports med cons

## 2013-09-05 NOTE — Progress Notes (Addendum)
   Subjective:    Patient ID: Sandra Petersen, female    DOB: [birth year], 63 y.o.   MRN: [id number]  HPI  The patient is here for a wellness exam. The patient has been doing well overall without major physical or psychological issues going on lately, except for deep pain in the R arm x months - "terrible", deep, sharp.  The patient needs to address  chronic hypertension that has been well controlled with medicines; to address chronic  hyperlipidemia controlled with medicines as well; and to address type 2 chronic diabetes, controlled with medical treatment or diet.   Review of Systems  Constitutional: Negative for fever, chills, diaphoresis, activity change, appetite change, fatigue and unexpected weight change.  HENT: Negative for congestion, facial swelling, mouth sores and sinus pressure.   Eyes: Negative for visual disturbance.  Respiratory: Negative for cough and chest tightness.   Gastrointestinal: Negative for nausea, abdominal pain and abdominal distention.  Genitourinary: Negative for frequency, difficulty urinating and vaginal pain.  Musculoskeletal: Positive for arthralgias. Negative for back pain and gait problem.  Skin: Negative for pallor and rash.  Neurological: Negative for dizziness, tremors, seizures, weakness, numbness and headaches.  Psychiatric/Behavioral: Negative for suicidal ideas, confusion, sleep disturbance, self-injury and decreased concentration. The patient is not nervous/anxious.        Objective:   Physical Exam  Constitutional: She appears well-developed. No distress.  HENT:  Head: Normocephalic.  Right Ear: External ear normal.  Left Ear: External ear normal.  Nose: Nose normal.  Mouth/Throat: Oropharynx is clear and moist.  Eyes: Conjunctivae are normal. Pupils are equal, round, and reactive to light. Right eye exhibits no discharge. Left eye exhibits no discharge.  Neck: Normal range of motion. Neck supple. No JVD present. No tracheal deviation  present. No thyromegaly present.  Cardiovascular: Normal rate, regular rhythm and normal heart sounds.   Pulmonary/Chest: No stridor. No respiratory distress. She has no wheezes.  Abdominal: Soft. Bowel sounds are normal. She exhibits no distension and no mass. There is no tenderness. There is no rebound and no guarding.  Musculoskeletal: She exhibits no edema and no tenderness.  Tender w/ROM R shoulder  Lymphadenopathy:    She has no cervical adenopathy.  Neurological: She displays normal reflexes. No cranial nerve deficit. She exhibits normal muscle tone. Coordination normal.  Skin: No rash noted. No erythema. No pallor.  Psychiatric: She has a normal mood and affect. Her behavior is normal. Judgment and thought content normal.          Assessment & Plan:

## 2013-09-05 NOTE — Assessment & Plan Note (Signed)
NAS diet RTC 4 mo

## 2013-09-06 ENCOUNTER — Telehealth: Payer: Self-pay

## 2013-09-06 NOTE — Telephone Encounter (Signed)
Relevant pt education material mailed  

## 2013-09-10 ENCOUNTER — Encounter: Payer: Self-pay | Admitting: Physician Assistant

## 2013-09-12 ENCOUNTER — Other Ambulatory Visit (INDEPENDENT_AMBULATORY_CARE_PROVIDER_SITE_OTHER): Payer: 59

## 2013-09-12 ENCOUNTER — Encounter: Payer: Self-pay | Admitting: Family Medicine

## 2013-09-12 ENCOUNTER — Ambulatory Visit (INDEPENDENT_AMBULATORY_CARE_PROVIDER_SITE_OTHER): Payer: 59 | Admitting: Family Medicine

## 2013-09-12 VITALS — BP 140/82 | HR 59 | Wt 138.0 lb

## 2013-09-12 DIAGNOSIS — M79601 Pain in right arm: Secondary | ICD-10-CM

## 2013-09-12 DIAGNOSIS — M79609 Pain in unspecified limb: Secondary | ICD-10-CM

## 2013-09-12 DIAGNOSIS — M67919 Unspecified disorder of synovium and tendon, unspecified shoulder: Secondary | ICD-10-CM

## 2013-09-12 DIAGNOSIS — M719 Bursopathy, unspecified: Secondary | ICD-10-CM

## 2013-09-12 DIAGNOSIS — M7551 Bursitis of right shoulder: Secondary | ICD-10-CM

## 2013-09-12 LAB — COLOGUARD: Cologuard: NEGATIVE

## 2013-09-12 MED ORDER — MELOXICAM 15 MG PO TABS: 15.0000 mg | ORAL_TABLET | Freq: Every day | ORAL | Status: DC

## 2013-09-12 NOTE — Patient Instructions (Signed)
Very nice to meet you Ice 20 minutes at the end of the day.  Meloxicam daily for 10 days then as needed Exercises 3 times a week  Come back again 3 weeks.

## 2013-09-12 NOTE — Assessment & Plan Note (Signed)
Patient an injection today with complete resolution of pain. Patient given home exercise program, anti-inflammatories as well as icing protocol. Patient will try these interventions and come back again in 3 weeks for further evaluation. Patient continues to have trouble we need to look more at cervical radiculopathy.

## 2013-09-12 NOTE — Progress Notes (Signed)
Tawana ScaleZach Arian Murley D.O. [facility] [address] [place], [place] [address] Phone: [phone] Subjective:    I'm seeing this patient by the request  of:  Sonda PrimesAlex Plotnikov, MD   CC: Right arm pain  UVO:[id number]:Subjective Sandra Pewheresa G Petersen is a 63 y.o. female coming in with complaint of right arm pain. Patient has had this pain intermittently over the course last 2 years but the last 2 months has increased in severity and now has a constant pain. Patient states muscle pain since being anterior aspect of the shoulder and can radiate down to her elbow. Patient denies any neck pain and denies any numbness or any weakness of the upper extremity. Patient states that the pain can keep her up at night he rates the pain as 8/10 in severity all times and can get significantly worse than that. Patient has had to stop work secondary to pain. Patient did see primary care provider and was diagnosed with a biceps tendinitis and was to get medications as well as a compression sleeve which patient did not do either.    Past medical history, social, surgical and family history all reviewed in electronic medical record.   Review of Systems: No headache, visual changes, nausea, vomiting, diarrhea, constipation, dizziness, abdominal pain, skin rash, fevers, chills, night sweats, weight loss, swollen lymph nodes, body aches, joint swelling, muscle aches, chest pain, shortness of breath, mood changes.   Objective Blood pressure 140/82, pulse 59, weight 138 lb (62.596 kg), SpO2 97.00%.  General: No apparent distress alert and oriented x3 mood and affect normal, dressed appropriately.  HEENT: Pupils equal, extraocular movements intact  Respiratory: Patient's speak in full sentences and does not appear short of breath  Cardiovascular: No lower extremity edema, non tender, no erythema  Skin: Warm dry intact with no signs of infection or rash on extremities or on axial skeleton.  Abdomen: Soft nontender  Neuro: Cranial  nerves II through XII are intact, neurovascularly intact in all extremities with 2+ DTRs and 2+ pulses.  Lymph: No lymphadenopathy of posterior or anterior cervical chain or axillae bilaterally.  Gait normal with good balance and coordination.  MSK:  Non tender with full range of motion and good stability and symmetric strength and tone of  elbows, wrist, hip, knee and ankles bilaterally.  Shoulder: Right Inspection reveals no abnormalities, atrophy or asymmetry. Palpation is normal with no tenderness over AC joint or bicipital groove. ROM is full in all planes passively. Rotator cuff strength normal throughout. signs of impingement with positive Neer and Hawkin's tests, but negative empty can sign. Speeds and Yergason's tests normal. No labral pathology noted with negative Obrien's, negative clunk and good stability. Normal scapular function observed. No painful arc and no drop arm sign. No apprehension sign  MSK US performed of: Right This study was ordered, performed, and interpreted by Terrilee FilesZach Jenasia Dolinar D.O.  Shoulder:   Supraspinatus:  Appears normal on long and transverse views, Bursal bulge seen with shoulder abduction on impingement view. Infraspinatus:  Appears normal on long and transverse views. Significant increase in Doppler flow Subscapularis:  Appears normal on long and transverse views. Positive bursa Teres Minor:  Appears normal on long and transverse views. AC joint:  Capsule undistended, no geyser sign. Glenohumeral Joint:  Appears normal without effusion. Glenoid Labrum:  Intact without visualized tears. Biceps Tendon:  Appears normal on long and transverse views, no fraying of tendon, tendon located in intertubercular groove, no subluxation with shoulder internal or external rotation.  Impression: Subacromial  bursitis  Procedure: Real-time Ultrasound Guided Injection of right glenohumeral joint Device: GE Logiq E  Ultrasound guided injection is preferred based studies  that show increased duration, increased effect, greater accuracy, decreased procedural pain, increased response rate with ultrasound guided versus blind injection.  Verbal informed consent obtained.  Time-out conducted.  Noted no overlying erythema, induration, or other signs of local infection.  Skin prepped in a sterile fashion.  Local anesthesia: Topical Ethyl chloride.  With sterile technique and under real time ultrasound guidance:  Joint visualized.  23g 1  inch needle inserted posterior approach. Pictures taken for needle placement. Patient did have injection of 2 cc of 1% lidocaine, 2 cc of 0.5% Marcaine, and 1.0 cc of Kenalog 40 mg/dL. Completed without difficulty  Pain immediately resolved suggesting accurate placement of the medication.  Advised to call if fevers/chills, erythema, induration, drainage, or persistent bleeding.  Images permanently stored and available for review in the ultrasound unit.  Impression: Technically successful ultrasound guided injection.      Impression and Recommendations:     This case required medical decision making of moderate complexity.

## 2013-09-26 ENCOUNTER — Telehealth: Payer: Self-pay | Admitting: *Deleted

## 2013-09-26 NOTE — Telephone Encounter (Signed)
Left detailed mess informing pt of negative Cologuard cancer screening test.

## 2013-10-03 ENCOUNTER — Ambulatory Visit: Payer: 59 | Admitting: Family Medicine

## 2013-10-23 ENCOUNTER — Encounter: Payer: Self-pay | Admitting: Internal Medicine

## 2014-01-09 ENCOUNTER — Other Ambulatory Visit (INDEPENDENT_AMBULATORY_CARE_PROVIDER_SITE_OTHER): Payer: 59

## 2014-01-09 ENCOUNTER — Ambulatory Visit (INDEPENDENT_AMBULATORY_CARE_PROVIDER_SITE_OTHER): Payer: 59 | Admitting: Internal Medicine

## 2014-01-09 ENCOUNTER — Encounter: Payer: Self-pay | Admitting: Internal Medicine

## 2014-01-09 VITALS — BP 142/84 | HR 65 | Temp 98.9°F | Resp 16 | Ht 62.0 in | Wt 136.4 lb

## 2014-01-09 DIAGNOSIS — M7710 Lateral epicondylitis, unspecified elbow: Secondary | ICD-10-CM

## 2014-01-09 DIAGNOSIS — IMO0001 Reserved for inherently not codable concepts without codable children: Secondary | ICD-10-CM

## 2014-01-09 DIAGNOSIS — E119 Type 2 diabetes mellitus without complications: Secondary | ICD-10-CM

## 2014-01-09 DIAGNOSIS — M7551 Bursitis of right shoulder: Secondary | ICD-10-CM

## 2014-01-09 DIAGNOSIS — M719 Bursopathy, unspecified: Secondary | ICD-10-CM

## 2014-01-09 DIAGNOSIS — R030 Elevated blood-pressure reading, without diagnosis of hypertension: Secondary | ICD-10-CM

## 2014-01-09 DIAGNOSIS — M67919 Unspecified disorder of synovium and tendon, unspecified shoulder: Secondary | ICD-10-CM

## 2014-01-09 DIAGNOSIS — M7711 Lateral epicondylitis, right elbow: Secondary | ICD-10-CM

## 2014-01-09 DIAGNOSIS — E559 Vitamin D deficiency, unspecified: Secondary | ICD-10-CM

## 2014-01-09 LAB — BASIC METABOLIC PANEL
BUN: 12 mg/dL (ref 6–23)
CO2: 31 mEq/L (ref 19–32)
CREATININE: 0.6 mg/dL (ref 0.4–1.2)
Calcium: 9.8 mg/dL (ref 8.4–10.5)
Chloride: 102 mEq/L (ref 96–112)
GFR: 129.52 mL/min (ref >60.00)
GLUCOSE: 88 mg/dL (ref 70–99)
POTASSIUM: 3.8 meq/L (ref 3.5–5.1)
Sodium: 139 mEq/L (ref 135–145)

## 2014-01-09 LAB — HEMOGLOBIN A1C: Hgb A1c MFr Bld: 7.1 % — ABNORMAL HIGH (ref 4.6–6.5)

## 2014-01-09 MED ORDER — METFORMIN HCL 500 MG PO TABS: 500.0000 mg | ORAL_TABLET | Freq: Two times a day (BID) | ORAL | Status: DC

## 2014-01-09 MED ORDER — MELOXICAM 15 MG PO TABS: 15.0000 mg | ORAL_TABLET | Freq: Every day | ORAL | Status: DC

## 2014-01-09 NOTE — Progress Notes (Signed)
Pre visit review using our clinic review tool, if applicable. No additional management support is needed unless otherwise documented below in the visit note. 

## 2014-01-09 NOTE — Assessment & Plan Note (Signed)
Vit D restart 

## 2014-01-09 NOTE — Progress Notes (Signed)
   Subjective:    HPI  F/u the R arm x months - better  The patient needs to address  chronic hypertension that has been well controlled with medicines; to address chronic  hyperlipidemia controlled with medicines as well; and to address type 2 chronic diabetes, controlled with medical treatment or diet.  BP Readings from Last 3 Encounters:  01/09/14 142/84  09/12/13 140/82  09/05/13 160/88   Wt Readings from Last 3 Encounters:  01/09/14 136 lb 6.4 oz (61.871 kg)  09/12/13 138 lb (62.596 kg)  09/05/13 138 lb (62.596 kg)       Review of Systems  Constitutional: Negative for fever, chills, diaphoresis, activity change, appetite change, fatigue and unexpected weight change.  HENT: Negative for congestion, facial swelling, mouth sores and sinus pressure.   Eyes: Negative for visual disturbance.  Respiratory: Negative for cough and chest tightness.   Gastrointestinal: Negative for nausea, abdominal pain and abdominal distention.  Genitourinary: Negative for frequency, difficulty urinating and vaginal pain.  Musculoskeletal: Positive for arthralgias. Negative for back pain and gait problem.  Skin: Negative for pallor and rash.  Neurological: Negative for dizziness, tremors, seizures, weakness, numbness and headaches.  Psychiatric/Behavioral: Negative for suicidal ideas, confusion, sleep disturbance, self-injury and decreased concentration. The patient is not nervous/anxious.        Objective:   Physical Exam  Constitutional: She appears well-developed. No distress.  HENT:  Head: Normocephalic.  Right Ear: External ear normal.  Left Ear: External ear normal.  Nose: Nose normal.  Mouth/Throat: Oropharynx is clear and moist.  Eyes: Conjunctivae are normal. Pupils are equal, round, and reactive to light. Right eye exhibits no discharge. Left eye exhibits no discharge.  Neck: Normal range of motion. Neck supple. No JVD present. No tracheal deviation present. No thyromegaly  present.  Cardiovascular: Normal rate, regular rhythm and normal heart sounds.   Pulmonary/Chest: No stridor. No respiratory distress. She has no wheezes.  Abdominal: Soft. Bowel sounds are normal. She exhibits no distension and no mass. There is no tenderness. There is no rebound and no guarding.  Musculoskeletal: She exhibits no edema and no tenderness.  Tender w/ROM R shoulder  Lymphadenopathy:    She has no cervical adenopathy.  Neurological: She displays normal reflexes. No cranial nerve deficit. She exhibits normal muscle tone. Coordination normal.  Skin: No rash noted. No erythema. No pallor.  Psychiatric: She has a normal mood and affect. Her behavior is normal. Judgment and thought content normal.  R lt epic is tender Lab Results  Component Value Date   WBC 7.9 07/25/2013   HGB 12.3 07/25/2013   HCT 37.3 07/25/2013   PLT 245.0 07/25/2013   GLUCOSE 103* 07/25/2013   CHOL 149 07/25/2013   TRIG 53.0 07/25/2013   HDL 50.70 07/25/2013   LDLCALC 88 07/25/2013   ALT 18 07/25/2013   AST 18 07/25/2013   NA 139 07/25/2013   K 4.1 07/25/2013   CL 105 07/25/2013   CREATININE 0.7 07/25/2013   BUN 16 07/25/2013   CO2 30 07/25/2013   TSH 2.05 07/25/2013   HGBA1C 7.1* 07/25/2013   MICROALBUR 1.2 07/25/2013          Assessment & Plan:

## 2014-01-09 NOTE — Assessment & Plan Note (Signed)
Labs  Continue with current prescription therapy as reflected on the Med list.  

## 2014-01-09 NOTE — Assessment & Plan Note (Signed)
R 8/15 work related Discussed Ice Mobic prn Band

## 2014-01-09 NOTE — Assessment & Plan Note (Signed)
BP Readings from Last 3 Encounters:  01/09/14 142/84  09/12/13 140/82  09/05/13 160/88

## 2014-01-09 NOTE — Assessment & Plan Note (Signed)
Better  

## 2014-09-04 ENCOUNTER — Encounter (HOSPITAL_COMMUNITY): Payer: Self-pay | Admitting: Emergency Medicine

## 2014-09-04 ENCOUNTER — Emergency Department (INDEPENDENT_AMBULATORY_CARE_PROVIDER_SITE_OTHER)
Admission: EM | Admit: 2014-09-04 | Discharge: 2014-09-04 | Disposition: A | Discharge status: Home / Self Care | Payer: 59 | Source: Home / Self Care | Attending: Family Medicine | Admitting: Family Medicine

## 2014-09-04 DIAGNOSIS — N390 Urinary tract infection, site not specified: Secondary | ICD-10-CM | POA: Diagnosis not present

## 2014-09-04 LAB — POCT URINALYSIS DIP (DEVICE)
Bilirubin Urine: NEGATIVE
Glucose, UA: NEGATIVE mg/dL
KETONES UR: NEGATIVE mg/dL
Nitrite: NEGATIVE
PROTEIN: NEGATIVE mg/dL
SPECIFIC GRAVITY, URINE: 1.02 (ref 1.005–1.030)
Urobilinogen, UA: 0.2 mg/dL (ref 0.0–1.0)
pH: 6 (ref 5.0–8.0)

## 2014-09-04 MED ORDER — PHENAZOPYRIDINE HCL 200 MG PO TABS: 200.0000 mg | ORAL_TABLET | Freq: Three times a day (TID) | ORAL | Status: DC | PRN

## 2014-09-04 MED ORDER — CEPHALEXIN 500 MG PO CAPS: 500.0000 mg | ORAL_CAPSULE | Freq: Four times a day (QID) | ORAL | Status: DC

## 2014-09-04 NOTE — Discharge Instructions (Signed)

## 2014-09-04 NOTE — ED Provider Notes (Signed)
CSN: [id number]     Arrival date & time 09/04/14  [id number] History   First MD Initiated Contact with Patient 09/04/14 1007     Chief Complaint  Patient presents with  . Urinary Tract Infection   (Consider location/radiation/quality/duration/timing/severity/associated sxs/prior Treatment) Patient is a 64 y.o. female presenting with urinary tract infection. The history is provided by the patient. No language interpreter was used.  Urinary Tract Infection This is a new problem. The current episode started more than 2 days ago. The problem occurs constantly. The problem has been gradually worsening. Pertinent negatives include no headaches. Nothing aggravates the symptoms. Nothing relieves the symptoms. She has tried nothing for the symptoms. The treatment provided no relief.    Past Medical History  Diagnosis Date  . Chronic headaches   . Diabetes mellitus without complication    History reviewed. No pertinent past surgical history. Family History  Problem Relation Age of Onset  . Diabetes Sister   . Hypertension Other     Parent  . Heart disease Mother   . Kidney disease Mother    History  Substance Use Topics  . Smoking status: Never Smoker   . Smokeless tobacco: Never Used  . Alcohol Use: No   OB History    No data available     Review of Systems  Neurological: Negative for headaches.  All other systems reviewed and are negative.   Allergies  Review of patient's allergies indicates no known allergies.  Home Medications   Prior to Admission medications   Medication Sig Start Date End Date Taking? Authorizing Provider  acetaminophen (TYLENOL) 325 MG tablet Take 650 mg by mouth every 6 (six) hours as needed (pain).    Historical Provider, MD  cholecalciferol (VITAMIN D) 1000 UNITS tablet Take 1 tablet (1,000 Units total) by mouth daily. 09/05/13 09/05/14  Aleksei Plotnikov V, MD  meloxicam (MOBIC) 15 MG tablet Take 1 tablet (15 mg total) by mouth daily. 01/09/14   Aleksei  Plotnikov V, MD  metFORMIN (GLUCOPHAGE) 500 MG tablet Take 1 tablet (500 mg total) by mouth 2 (two) times daily with a meal. 01/09/14   Aleksei Plotnikov V, MD   BP 133/87 mmHg  Pulse 65  Temp(Src) 98.7 F (37.1 C) (Oral)  Resp 16  SpO2 100% Physical Exam  Constitutional: She is oriented to person, place, and time. She appears well-developed and well-nourished.  HENT:  Head: Normocephalic.  Eyes: EOM are normal. Pupils are equal, round, and reactive to light.  Neck: Normal range of motion.  Cardiovascular: Normal rate and normal heart sounds.   Pulmonary/Chest: Effort normal.  Abdominal: Soft. She exhibits no distension.  Musculoskeletal: Normal range of motion.  Neurological: She is alert and oriented to person, place, and time.  Skin: Skin is warm.  Psychiatric: She has a normal mood and affect.  Nursing note and vitals reviewed.   ED Course  Procedures (including critical care time) Labs Review Labs Reviewed  POCT URINALYSIS DIP (DEVICE) - Abnormal; Notable for the following:    Hgb urine dipstick SMALL (*)    Leukocytes, UA LARGE (*)    All other components within normal limits    Imaging Review No results found.   MDM   1. UTI (lower urinary tract infection)     Keflex Pyridium Urine culture pending AVS  Elson AreasLeslie K Sofia, PA-C 09/04/14 1025  [address] [place] Fort[place], PA-C 09/04/14 1025  Lonia SkinnerLeslie K [place], [place]-C 09/04/14 1026

## 2014-09-04 NOTE — ED Notes (Signed)
C/o uti States she has pain when urinating  Denies any abd pain States she does have some discharge Cranberry juice was used as tx

## 2014-12-13 ENCOUNTER — Encounter (HOSPITAL_COMMUNITY): Payer: Self-pay | Admitting: Emergency Medicine

## 2014-12-13 ENCOUNTER — Emergency Department (HOSPITAL_COMMUNITY)
Admission: EM | Admit: 2014-12-13 | Discharge: 2014-12-14 | Disposition: A | Discharge status: Home / Self Care | Payer: 59 | Attending: Emergency Medicine | Admitting: Emergency Medicine

## 2014-12-13 DIAGNOSIS — Z791 Long term (current) use of non-steroidal anti-inflammatories (NSAID): Secondary | ICD-10-CM | POA: Diagnosis not present

## 2014-12-13 DIAGNOSIS — Z79899 Other long term (current) drug therapy: Secondary | ICD-10-CM | POA: Diagnosis not present

## 2014-12-13 DIAGNOSIS — L509 Urticaria, unspecified: Secondary | ICD-10-CM | POA: Insufficient documentation

## 2014-12-13 DIAGNOSIS — E119 Type 2 diabetes mellitus without complications: Secondary | ICD-10-CM | POA: Diagnosis not present

## 2014-12-13 DIAGNOSIS — R21 Rash and other nonspecific skin eruption: Secondary | ICD-10-CM | POA: Diagnosis present

## 2014-12-13 DIAGNOSIS — Z792 Long term (current) use of antibiotics: Secondary | ICD-10-CM | POA: Insufficient documentation

## 2014-12-13 MED ORDER — PREDNISONE 20 MG PO TABS
60.0000 mg | ORAL_TABLET | Freq: Once | ORAL | Status: AC
  Administered 2014-12-13: 60 mg via ORAL
  Filled 2014-12-13: qty 3

## 2014-12-13 MED ORDER — DIPHENHYDRAMINE HCL 25 MG PO CAPS
25.0000 mg | ORAL_CAPSULE | Freq: Once | ORAL | Status: AC
  Administered 2014-12-13: 25 mg via ORAL
  Filled 2014-12-13: qty 1

## 2014-12-13 NOTE — ED Provider Notes (Signed)
CSN: [id number]     Arrival date & time 12/13/14  2235 History  This chart was scribed for non-physician practitioner, Emilia Beck, PA-C working with Mancel Bale, MD by Doreatha Martin, ED scribe. This patient was seen in room WTR6/WTR6 and the patient's care was started at 11:27 PM     Chief Complaint  Patient presents with  . Rash   The history is provided by the patient. No language interpreter was used.    HPI Comments: Sandra Petersen is a 64 y.o. female who presents to the Emergency Department complaining of a moderate rash on the chest, upper and lower extremities onset tonight. She states associated itching that has since improved. Pt states that the rash began on her right upper arm and spread to her left upper arm, chest and thighs. No treatments tried PTA. She states no new medication, soap, lotion, foods. She denies SOB.    Past Medical History  Diagnosis Date  . Chronic headaches   . Diabetes mellitus without complication    History reviewed. No pertinent past surgical history. Family History  Problem Relation Age of Onset  . Diabetes Sister   . Hypertension Other     Parent  . Heart disease Mother   . Kidney disease Mother    History  Substance Use Topics  . Smoking status: Never Smoker   . Smokeless tobacco: Never Used  . Alcohol Use: No   OB History    No data available     Review of Systems  Respiratory: Negative for shortness of breath.   Skin: Positive for rash.  All other systems reviewed and are negative.  Allergies  Review of patient's allergies indicates no known allergies.  Home Medications   Prior to Admission medications   Medication Sig Start Date End Date Taking? Authorizing Provider  acetaminophen (TYLENOL) 325 MG tablet Take 650 mg by mouth every 6 (six) hours as needed (pain).    Historical Provider, MD  cephALEXin (KEFLEX) 500 MG capsule Take 1 capsule (500 mg total) by mouth 4 (four) times daily. 09/04/14   Elson Areas, PA-C   meloxicam (MOBIC) 15 MG tablet Take 1 tablet (15 mg total) by mouth daily. 01/09/14   Aleksei Plotnikov V, MD  metFORMIN (GLUCOPHAGE) 500 MG tablet Take 1 tablet (500 mg total) by mouth 2 (two) times daily with a meal. 01/09/14   Aleksei Plotnikov V, MD  phenazopyridine (PYRIDIUM) 200 MG tablet Take 1 tablet (200 mg total) by mouth 3 (three) times daily as needed for pain. 09/04/14   Elson Areas, PA-C   BP 142/56 mmHg  Pulse 71  Temp(Src) 98.1 F (36.7 C) (Oral)  Resp 18  SpO2 99% Physical Exam  Constitutional: She is oriented to person, place, and time. She appears well-developed and well-nourished. No distress.  HENT:  Head: Normocephalic and atraumatic.  Eyes: Conjunctivae and EOM are normal. Pupils are equal, round, and reactive to light.  Neck: Normal range of motion. Neck supple.  Cardiovascular: Normal rate and regular rhythm.  Exam reveals no gallop and no friction rub.   No murmur heard. Pulmonary/Chest: Effort normal and breath sounds normal. No respiratory distress. She has no wheezes. She has no rales. She exhibits no tenderness.  Abdominal: Soft. She exhibits no distension. There is no tenderness.  Musculoskeletal: Normal range of motion.  Neurological: She is alert and oriented to person, place, and time.  Speech is goal-oriented. Moves limbs without ataxia.   Skin: Skin is warm and dry.  Scattered urticarial lesions of bilateral arms and a small area of the right anterior thigh.   Psychiatric: She has a normal mood and affect. Her behavior is normal.  Nursing note and vitals reviewed.   ED Course  Procedures (including critical care time) DIAGNOSTIC STUDIES: Oxygen Saturation is 99% on RA, normal by my interpretation.    COORDINATION OF CARE: 11:29 PM Discussed treatment plan with pt at bedside and pt agreed to plan.   Labs Review Labs Reviewed - No data to display  Imaging Review No results found.   EKG Interpretation None      MDM   Final diagnoses:   Urticaria    12:19 AM Patient with localized urticarial lesions with overlying excoriations. No SOB or throat closing. Vitals stable and patient afebrile. Patient given prednisone and benadryl here. Patient will be discharged with the same.   I personally performed the services described in this documentation, which was scribed in my presence. The recorded information has been reviewed and is accurate.    [address] [place], PA-C 12/14/14 0020  Raeford Razor, MD 12/14/14 [phone]

## 2014-12-13 NOTE — ED Notes (Signed)
Pt from home c/o right upper arm rash that goes to the right breast. Pt denies any new use of products. Pt has not tried over the counter medications. Denies shortness breath.

## 2014-12-14 MED ORDER — PREDNISONE 20 MG PO TABS: 40.0000 mg | ORAL_TABLET | Freq: Every day | ORAL | Status: DC

## 2014-12-14 MED ORDER — DIPHENHYDRAMINE HCL 25 MG PO TABS: 25.0000 mg | ORAL_TABLET | Freq: Four times a day (QID) | ORAL | Status: DC | PRN

## 2014-12-14 MED ORDER — HYDROCORTISONE 1 % EX CREA: TOPICAL_CREAM | CUTANEOUS | Status: DC

## 2014-12-14 NOTE — Discharge Instructions (Signed)
Take prednisone as directed until gone. Take benadryl as needed for itching. Use cortisone cream as needed for itching.

## 2014-12-18 ENCOUNTER — Encounter: Payer: Self-pay | Admitting: Internal Medicine

## 2014-12-18 ENCOUNTER — Ambulatory Visit (INDEPENDENT_AMBULATORY_CARE_PROVIDER_SITE_OTHER): Payer: 59 | Admitting: Internal Medicine

## 2014-12-18 VITALS — BP 140/78 | HR 76 | Ht 62.0 in | Wt 139.0 lb

## 2014-12-18 DIAGNOSIS — E119 Type 2 diabetes mellitus without complications: Secondary | ICD-10-CM | POA: Diagnosis not present

## 2014-12-18 DIAGNOSIS — E559 Vitamin D deficiency, unspecified: Secondary | ICD-10-CM | POA: Diagnosis not present

## 2014-12-18 DIAGNOSIS — Z0000 Encounter for general adult medical examination without abnormal findings: Secondary | ICD-10-CM

## 2014-12-18 DIAGNOSIS — E538 Deficiency of other specified B group vitamins: Secondary | ICD-10-CM

## 2014-12-18 DIAGNOSIS — R202 Paresthesia of skin: Secondary | ICD-10-CM | POA: Insufficient documentation

## 2014-12-18 MED ORDER — METFORMIN HCL 500 MG PO TABS: 500.0000 mg | ORAL_TABLET | Freq: Two times a day (BID) | ORAL | Status: DC

## 2014-12-18 NOTE — Assessment & Plan Note (Signed)
Labs On Metformin 

## 2014-12-18 NOTE — Assessment & Plan Note (Addendum)
We discussed age appropriate health related issues, including available/recomended screening tests and vaccinations. We discussed a need for adhering to healthy diet and exercise. Labs/EKG were reviewed/ordered. All questions were answered. S/p partial hysterect in 1982  Refused shots,  cologuard (-) 2015 Ophth, mammo q 12-24 mo

## 2014-12-18 NOTE — Progress Notes (Signed)
Pre visit review using our clinic review tool, if applicable. No additional management support is needed unless otherwise documented below in the visit note. 

## 2014-12-18 NOTE — Assessment & Plan Note (Signed)
Labs, B12 A1c ?neuropathy

## 2014-12-18 NOTE — Assessment & Plan Note (Signed)
Risks associated with treatment noncompliance were discussed. Compliance was encouraged. Labs  

## 2014-12-18 NOTE — Progress Notes (Signed)
Subjective:  Patient ID: Sandra Petersen, female    DOB: 02/01/1951  Age: 64 y.o. MRN: [id number]  CC: Annual Exam   HPI Sandra Petersen presents for a well exam. F/u DM and HTN. Cologuard 2015 (-)  Outpatient Prescriptions Prior to Visit  Medication Sig Dispense Refill  . acetaminophen (TYLENOL) 325 MG tablet Take 650 mg by mouth every 6 (six) hours as needed (pain).    Marland Kitchen diphenhydrAMINE (BENADRYL) 25 MG tablet Take 1 tablet (25 mg total) by mouth every 6 (six) hours as needed for itching (Rash). 30 tablet 0  . metFORMIN (GLUCOPHAGE) 500 MG tablet Take 1 tablet (500 mg total) by mouth 2 (two) times daily with a meal. 60 tablet 11  . phenazopyridine (PYRIDIUM) 200 MG tablet Take 1 tablet (200 mg total) by mouth 3 (three) times daily as needed for pain. 10 tablet 0  . cephALEXin (KEFLEX) 500 MG capsule Take 1 capsule (500 mg total) by mouth 4 (four) times daily. 40 capsule 0  . predniSONE (DELTASONE) 20 MG tablet Take 2 tablets (40 mg total) by mouth daily. Take 40 mg by mouth daily for 3 days, then 20mg  by mouth daily for 3 days, then 10mg  daily for 3 days 12 tablet 0  . hydrocortisone cream 1 % Apply to affected area 2 times daily (Patient not taking: Reported on 12/18/2014) 15 g 0  . meloxicam (MOBIC) 15 MG tablet Take 1 tablet (15 mg total) by mouth daily. (Patient not taking: Reported on 12/18/2014) 30 tablet 1   No facility-administered medications prior to visit.    ROS Review of Systems  Constitutional: Negative for chills, diaphoresis, activity change, appetite change, fatigue and unexpected weight change.  HENT: Negative for congestion, ear discharge, mouth sores and sinus pressure.   Eyes: Negative for visual disturbance.  Respiratory: Negative for cough and chest tightness.   Gastrointestinal: Negative for nausea, vomiting and abdominal pain.  Genitourinary: Negative for frequency, difficulty urinating and vaginal pain.  Musculoskeletal: Negative for back pain and gait  problem.  Skin: Negative for pallor and rash.  Neurological: Negative for dizziness, tremors, weakness, numbness and headaches.  Psychiatric/Behavioral: Negative for suicidal ideas, confusion and sleep disturbance. The patient is not nervous/anxious.     Objective:  BP 140/78 mmHg  Pulse 76  Ht 5\' 2"  (1.575 m)  Wt 139 lb (63.05 kg)  BMI 25.42 kg/m2  SpO2 99%  BP Readings from Last 3 Encounters:  12/18/14 140/78  12/13/14 142/56  09/04/14 133/87    Wt Readings from Last 3 Encounters:  12/18/14 139 lb (63.05 kg)  01/09/14 136 lb 6.4 oz (61.871 kg)  09/12/13 138 lb (62.596 kg)    Physical Exam  Constitutional: She appears well-developed. No distress.  Pt looks younger than 21  HENT:  Head: Normocephalic.  Right Ear: External ear normal.  Left Ear: External ear normal.  Nose: Nose normal.  Mouth/Throat: Oropharynx is clear and moist.  Eyes: Conjunctivae are normal. Pupils are equal, round, and reactive to light. Right eye exhibits no discharge. Left eye exhibits no discharge.  Neck: Normal range of motion. Neck supple. No JVD present. No tracheal deviation present. No thyromegaly present.  Cardiovascular: Normal rate, regular rhythm and normal heart sounds.   Pulmonary/Chest: No stridor. No respiratory distress. She has no wheezes.  Abdominal: Soft. Bowel sounds are normal. She exhibits no distension and no mass. There is no tenderness. There is no rebound and no guarding.  Musculoskeletal: She exhibits no edema or tenderness.  Lymphadenopathy:    She has no cervical adenopathy.  Neurological: She displays normal reflexes. No cranial nerve deficit. She exhibits normal muscle tone. Coordination normal.  Skin: No rash noted. No erythema.  Psychiatric: She has a normal mood and affect. Her behavior is normal. Judgment and thought content normal.    Lab Results  Component Value Date   WBC 7.9 07/25/2013   HGB 12.3 07/25/2013   HCT 37.3 07/25/2013   PLT 245.0 07/25/2013    GLUCOSE 88 01/09/2014   CHOL 149 07/25/2013   TRIG 53.0 07/25/2013   HDL 50.70 07/25/2013   LDLCALC 88 07/25/2013   ALT 18 07/25/2013   AST 18 07/25/2013   NA 139 01/09/2014   K 3.8 01/09/2014   CL 102 01/09/2014   CREATININE 0.6 01/09/2014   BUN 12 01/09/2014   CO2 31 01/09/2014   TSH 2.05 07/25/2013   HGBA1C 7.1* 01/09/2014   MICROALBUR 1.2 07/25/2013    No results found.  Assessment & Plan:   There are no diagnoses linked to this encounter. I have discontinued Ms. Pullara's cephALEXin and predniSONE. I am also having her maintain her acetaminophen, metFORMIN, meloxicam, phenazopyridine, diphenhydrAMINE, and hydrocortisone cream.  No orders of the defined types were placed in this encounter.     Follow-up: No Follow-up on file.  Sonda Primes, MD

## 2014-12-23 ENCOUNTER — Telehealth: Payer: Self-pay | Admitting: Internal Medicine

## 2014-12-23 ENCOUNTER — Other Ambulatory Visit (INDEPENDENT_AMBULATORY_CARE_PROVIDER_SITE_OTHER): Payer: 59

## 2014-12-23 ENCOUNTER — Other Ambulatory Visit: Payer: Self-pay | Admitting: Internal Medicine

## 2014-12-23 DIAGNOSIS — Z0000 Encounter for general adult medical examination without abnormal findings: Secondary | ICD-10-CM

## 2014-12-23 DIAGNOSIS — R202 Paresthesia of skin: Secondary | ICD-10-CM | POA: Diagnosis not present

## 2014-12-23 DIAGNOSIS — E538 Deficiency of other specified B group vitamins: Secondary | ICD-10-CM | POA: Insufficient documentation

## 2014-12-23 DIAGNOSIS — E119 Type 2 diabetes mellitus without complications: Secondary | ICD-10-CM

## 2014-12-23 DIAGNOSIS — E559 Vitamin D deficiency, unspecified: Secondary | ICD-10-CM

## 2014-12-23 LAB — URINALYSIS, ROUTINE W REFLEX MICROSCOPIC
Bilirubin Urine: NEGATIVE
Ketones, ur: NEGATIVE
Nitrite: NEGATIVE
Specific Gravity, Urine: 1.02 (ref 1.000–1.030)
Total Protein, Urine: NEGATIVE
Urine Glucose: NEGATIVE
Urobilinogen, UA: 0.2 (ref 0.0–1.0)
pH: 6 (ref 5.0–8.0)

## 2014-12-23 LAB — CBC WITH DIFFERENTIAL/PLATELET
Basophils Absolute: 0 10*3/uL (ref 0.0–0.1)
Basophils Relative: 0.3 % (ref 0.0–3.0)
Eosinophils Absolute: 0.3 10*3/uL (ref 0.0–0.7)
Eosinophils Relative: 2.8 % (ref 0.0–5.0)
HCT: 35.8 % — ABNORMAL LOW (ref 36.0–46.0)
Hemoglobin: 11.7 g/dL — ABNORMAL LOW (ref 12.0–15.0)
Lymphocytes Relative: 43 % (ref 12.0–46.0)
Lymphs Abs: 4 10*3/uL (ref 0.7–4.0)
MCHC: 32.6 g/dL (ref 30.0–36.0)
MCV: 92.2 fl (ref 78.0–100.0)
Monocytes Absolute: 0.8 10*3/uL (ref 0.1–1.0)
Monocytes Relative: 8.3 % (ref 3.0–12.0)
Neutro Abs: 4.3 10*3/uL (ref 1.4–7.7)
Neutrophils Relative %: 45.6 % (ref 43.0–77.0)
Platelets: 234 10*3/uL (ref 150.0–400.0)
RBC: 3.88 Mil/uL (ref 3.87–5.11)
RDW: 12.9 % (ref 11.5–15.5)
WBC: 9.3 10*3/uL (ref 4.0–10.5)

## 2014-12-23 LAB — HEPATIC FUNCTION PANEL
ALBUMIN: 4.2 g/dL (ref 3.5–5.2)
ALT: 13 U/L (ref 0–35)
AST: 16 U/L (ref 0–37)
Alkaline Phosphatase: 80 U/L (ref 39–117)
BILIRUBIN TOTAL: 0.5 mg/dL (ref 0.2–1.2)
Bilirubin, Direct: 0.1 mg/dL (ref 0.0–0.3)
TOTAL PROTEIN: 7.5 g/dL (ref 6.0–8.3)

## 2014-12-23 LAB — LIPID PANEL
CHOLESTEROL: 145 mg/dL (ref 0–200)
HDL: 46 mg/dL (ref >39.00)
LDL CALC: 87 mg/dL (ref 0–99)
NonHDL: 98.79
Total CHOL/HDL Ratio: 3
Triglycerides: 58 mg/dL (ref 0.0–149.0)
VLDL: 11.6 mg/dL (ref 0.0–40.0)

## 2014-12-23 LAB — BASIC METABOLIC PANEL
BUN: 18 mg/dL (ref 6–23)
CO2: 30 mEq/L (ref 19–32)
Calcium: 9.4 mg/dL (ref 8.4–10.5)
Chloride: 103 mEq/L (ref 96–112)
Creatinine, Ser: 0.75 mg/dL (ref 0.40–1.20)
GFR: 99.81 mL/min (ref >60.00)
Glucose, Bld: 126 mg/dL — ABNORMAL HIGH (ref 70–99)
Potassium: 3.4 mEq/L — ABNORMAL LOW (ref 3.5–5.1)
Sodium: 141 mEq/L (ref 135–145)

## 2014-12-23 LAB — HEMOGLOBIN A1C: HEMOGLOBIN A1C: 6.9 % — AB (ref 4.6–6.5)

## 2014-12-23 LAB — VITAMIN B12: Vitamin B-12: 239 pg/mL (ref 211–911)

## 2014-12-23 LAB — TSH: TSH: 4.07 u[IU]/mL (ref 0.35–4.50)

## 2014-12-23 LAB — VITAMIN D 25 HYDROXY (VIT D DEFICIENCY, FRACTURES): VITD: 17.22 ng/mL — ABNORMAL LOW (ref 30.00–100.00)

## 2014-12-23 LAB — MICROALBUMIN / CREATININE URINE RATIO
Creatinine,U: 145.4 mg/dL
MICROALB UR: 1.1 mg/dL (ref 0.0–1.9)
Microalb Creat Ratio: 0.8 mg/g (ref 0.0–30.0)

## 2014-12-23 MED ORDER — VITAMIN B-12 1000 MCG SL SUBL: 1.0000 | SUBLINGUAL_TABLET | Freq: Every day | SUBLINGUAL | Status: DC

## 2014-12-23 MED ORDER — CIPROFLOXACIN HCL 250 MG PO TABS: 250.0000 mg | ORAL_TABLET | Freq: Two times a day (BID) | ORAL | Status: DC

## 2014-12-23 MED ORDER — VITAMIN D3 50 MCG (2000 UT) PO CAPS: 2000.0000 [IU] | ORAL_CAPSULE | Freq: Every day | ORAL | Status: DC

## 2014-12-23 MED ORDER — ERGOCALCIFEROL 1.25 MG (50000 UT) PO CAPS: 50000.0000 [IU] | ORAL_CAPSULE | ORAL | Status: DC

## 2014-12-23 NOTE — Assessment & Plan Note (Signed)
Mild SL B12

## 2014-12-23 NOTE — Addendum Note (Signed)
Addended by: Tresa Garter on: 12/23/2014 10:00 PM   Modules accepted: Orders, SmartSet

## 2014-12-23 NOTE — Telephone Encounter (Signed)
Sandra Petersen, please, also inform patient that she has a UTI - take cipro Thx

## 2014-12-24 NOTE — Telephone Encounter (Signed)
Pt informed

## 2015-01-15 ENCOUNTER — Other Ambulatory Visit: Payer: Self-pay | Admitting: Internal Medicine

## 2015-01-15 DIAGNOSIS — R921 Mammographic calcification found on diagnostic imaging of breast: Secondary | ICD-10-CM

## 2015-05-26 ENCOUNTER — Emergency Department (HOSPITAL_COMMUNITY): Payer: 59

## 2015-05-26 ENCOUNTER — Emergency Department (HOSPITAL_COMMUNITY)
Admission: EM | Admit: 2015-05-26 | Discharge: 2015-05-27 | Disposition: A | Discharge status: Home / Self Care | Payer: 59 | Attending: Emergency Medicine | Admitting: Emergency Medicine

## 2015-05-26 ENCOUNTER — Encounter (HOSPITAL_COMMUNITY): Payer: Self-pay | Admitting: Emergency Medicine

## 2015-05-26 DIAGNOSIS — G8929 Other chronic pain: Secondary | ICD-10-CM | POA: Diagnosis not present

## 2015-05-26 DIAGNOSIS — R519 Headache, unspecified: Secondary | ICD-10-CM

## 2015-05-26 DIAGNOSIS — R110 Nausea: Secondary | ICD-10-CM | POA: Diagnosis not present

## 2015-05-26 DIAGNOSIS — M542 Cervicalgia: Secondary | ICD-10-CM | POA: Insufficient documentation

## 2015-05-26 DIAGNOSIS — Z7984 Long term (current) use of oral hypoglycemic drugs: Secondary | ICD-10-CM | POA: Insufficient documentation

## 2015-05-26 DIAGNOSIS — R197 Diarrhea, unspecified: Secondary | ICD-10-CM | POA: Insufficient documentation

## 2015-05-26 DIAGNOSIS — E119 Type 2 diabetes mellitus without complications: Secondary | ICD-10-CM | POA: Diagnosis not present

## 2015-05-26 DIAGNOSIS — R51 Headache: Secondary | ICD-10-CM | POA: Insufficient documentation

## 2015-05-26 DIAGNOSIS — Z792 Long term (current) use of antibiotics: Secondary | ICD-10-CM | POA: Insufficient documentation

## 2015-05-26 DIAGNOSIS — Z79899 Other long term (current) drug therapy: Secondary | ICD-10-CM | POA: Diagnosis not present

## 2015-05-26 LAB — COMPREHENSIVE METABOLIC PANEL
ALBUMIN: 4.7 g/dL (ref 3.5–5.0)
ALK PHOS: 94 U/L (ref 38–126)
ALT: 22 U/L (ref 14–54)
ANION GAP: 9 (ref 5–15)
AST: 23 U/L (ref 15–41)
BUN: 12 mg/dL (ref 6–20)
CO2: 28 mmol/L (ref 22–32)
Calcium: 9.8 mg/dL (ref 8.9–10.3)
Chloride: 102 mmol/L (ref 101–111)
Creatinine, Ser: 0.74 mg/dL (ref 0.44–1.00)
GFR calc Af Amer: >60 mL/min (ref >60)
GFR calc non Af Amer: >60 mL/min (ref >60)
GLUCOSE: 116 mg/dL — AB (ref 65–99)
Potassium: 4.1 mmol/L (ref 3.5–5.1)
SODIUM: 139 mmol/L (ref 135–145)
Total Bilirubin: 0.6 mg/dL (ref 0.3–1.2)
Total Protein: 8.5 g/dL — ABNORMAL HIGH (ref 6.5–8.1)

## 2015-05-26 LAB — URINALYSIS, ROUTINE W REFLEX MICROSCOPIC
BILIRUBIN URINE: NEGATIVE
BILIRUBIN URINE: NEGATIVE
GLUCOSE, UA: NEGATIVE mg/dL
GLUCOSE, UA: NEGATIVE mg/dL
KETONES UR: NEGATIVE mg/dL
Ketones, ur: NEGATIVE mg/dL
Nitrite: NEGATIVE
Nitrite: NEGATIVE
PH: 5.5 (ref 5.0–8.0)
PROTEIN: NEGATIVE mg/dL
Protein, ur: NEGATIVE mg/dL
SPECIFIC GRAVITY, URINE: 1.022 (ref 1.005–1.030)
Specific Gravity, Urine: 1.021 (ref 1.005–1.030)
pH: 5.5 (ref 5.0–8.0)

## 2015-05-26 LAB — CBC
HEMATOCRIT: 39.8 % (ref 36.0–46.0)
HEMOGLOBIN: 12.6 g/dL (ref 12.0–15.0)
MCH: 30.2 pg (ref 26.0–34.0)
MCHC: 31.7 g/dL (ref 30.0–36.0)
MCV: 95.4 fL (ref 78.0–100.0)
Platelets: 243 10*3/uL (ref 150–400)
RBC: 4.17 MIL/uL (ref 3.87–5.11)
RDW: 12.7 % (ref 11.5–15.5)
WBC: 8.1 10*3/uL (ref 4.0–10.5)

## 2015-05-26 LAB — URINE MICROSCOPIC-ADD ON

## 2015-05-26 LAB — LIPASE, BLOOD: Lipase: 23 U/L (ref 11–51)

## 2015-05-26 MED ORDER — SODIUM CHLORIDE 0.9 % IV BOLUS (SEPSIS)
1000.0000 mL | Freq: Once | INTRAVENOUS | Status: AC
  Administered 2015-05-26: 1000 mL via INTRAVENOUS

## 2015-05-26 MED ORDER — ONDANSETRON HCL 4 MG/2ML IJ SOLN
4.0000 mg | Freq: Once | INTRAMUSCULAR | Status: AC
  Administered 2015-05-26: 4 mg via INTRAVENOUS
  Filled 2015-05-26: qty 2

## 2015-05-26 MED ORDER — KETOROLAC TROMETHAMINE 30 MG/ML IJ SOLN
15.0000 mg | Freq: Once | INTRAMUSCULAR | Status: AC
  Administered 2015-05-26: 15 mg via INTRAVENOUS
  Filled 2015-05-26: qty 1

## 2015-05-26 MED ORDER — ACETAMINOPHEN 325 MG PO TABS
650.0000 mg | ORAL_TABLET | Freq: Once | ORAL | Status: AC
  Administered 2015-05-26: 650 mg via ORAL
  Filled 2015-05-26: qty 2

## 2015-05-26 NOTE — ED Provider Notes (Signed)
CSN: [id number]     Arrival date & time 05/26/15  1403 History   First MD Initiated Contact with Patient 05/26/15 2015     Chief Complaint  Patient presents with  . Headache  . Diarrhea     (Consider location/radiation/quality/duration/timing/severity/associated sxs/prior Treatment) HPI  65 year old female presents with diarrhea since around 3 AM. States she has been having loose, watery stools since then. Around 5-6 in total. Gets some abdominal cramping that then goes away once she has a bowel movement. No current abdominal pain. No vomiting or urinary symptoms. She does feel nauseated. She also has a posterior headache seems to radiate up to the top of her scalp as well as down her neck. Has had pain like this a couple times per week over past several months. However today it is same quality but much worse. Gradual in onset, started with diarrhea. No fevers. No blurry vision or vomiting. Has never had these headaches evaluated. No recent antibiotics, travel. No blood in stool.  Past Medical History  Diagnosis Date  . Chronic headaches   . Diabetes mellitus without complication [facility](HCC)    Past Surgical History  Procedure Laterality Date  . Abdominal hysterectomy  1982    partial   Family History  Problem Relation Age of Onset  . Diabetes Sister   . Hypertension Other     Parent  . Heart disease Mother   . Kidney disease Mother    Social History  Substance Use Topics  . Smoking status: Never Smoker   . Smokeless tobacco: Never Used  . Alcohol Use: No   OB History    No data available     Review of Systems  Constitutional: Negative for fever.  Eyes: Negative for visual disturbance.  Gastrointestinal: Positive for nausea and diarrhea. Negative for vomiting and abdominal pain.  Genitourinary: Negative for dysuria.  Musculoskeletal: Positive for neck pain.  Neurological: Positive for headaches. Negative for weakness and numbness.  All other systems reviewed and are  negative.     Allergies  Review of patient's allergies indicates no known allergies.  Home Medications   Prior to Admission medications   Medication Sig Start Date End Date Taking? Authorizing Provider  acetaminophen (TYLENOL) 325 MG tablet Take 650 mg by mouth every 6 (six) hours as needed (pain).    Historical Provider, MD  Cholecalciferol (VITAMIN D3) 2000 UNITS capsule Take 1 capsule (2,000 Units total) by mouth daily. 12/23/14   Aleksei Plotnikov V, MD  ciprofloxacin (CIPRO) 250 MG tablet Take 1 tablet (250 mg total) by mouth 2 (two) times daily. 12/23/14   Aleksei Plotnikov V, MD  Cyanocobalamin (VITAMIN B-12) 1000 MCG SUBL Place 1 tablet (1,000 mcg total) under the tongue daily. 12/23/14   Aleksei Plotnikov V, MD  diphenhydrAMINE (BENADRYL) 25 MG tablet Take 1 tablet (25 mg total) by mouth every 6 (six) hours as needed for itching (Rash). 12/14/14   Emilia BeckKaitlyn Szekalski, PA-C  ergocalciferol (VITAMIN D2) 50000 UNITS capsule Take 1 capsule (50,000 Units total) by mouth once a week. 12/23/14   Aleksei Plotnikov V, MD  hydrocortisone cream 1 % Apply to affected area 2 times daily Patient not taking: Reported on 12/18/2014 12/14/14   Emilia BeckKaitlyn Szekalski, PA-C  meloxicam (MOBIC) 15 MG tablet Take 1 tablet (15 mg total) by mouth daily. Patient not taking: Reported on 12/18/2014 01/09/14   Macarthur CritchleyAleksei Plotnikov V, MD  metFORMIN (GLUCOPHAGE) 500 MG tablet Take 1 tablet (500 mg total) by mouth 2 (two) times daily with a  meal. 12/18/14   Jacinta Shoe V, MD  phenazopyridine (PYRIDIUM) 200 MG tablet Take 1 tablet (200 mg total) by mouth 3 (three) times daily as needed for pain. 09/04/14   Elson Areas, PA-C   BP 133/69 mmHg  Pulse 84  Temp(Src) 100.6 F (38.1 C) (Oral)  Resp 15  SpO2 100% Physical Exam  Constitutional: She is oriented to person, place, and time. She appears well-developed and well-nourished.  HENT:  Head: Normocephalic and atraumatic.  Right Ear: External ear normal.  Left Ear:  External ear normal.  Nose: Nose normal.  Eyes: EOM are normal. Pupils are equal, round, and reactive to light. Right eye exhibits no discharge. Left eye exhibits no discharge.  Neck: Normal range of motion. Neck supple.  No meningismus  Cardiovascular: Normal rate, regular rhythm and normal heart sounds.   Pulmonary/Chest: Effort normal and breath sounds normal.  Abdominal: Soft. There is no tenderness.  Neurological: She is alert and oriented to person, place, and time.  CN 2-12 grossly intact. 5/5 strength in all 4 extremities. Grossly normal sensation.  Skin: Skin is warm and dry.  Nursing note and vitals reviewed.   ED Course  Procedures (including critical care time) Labs Review Labs Reviewed  COMPREHENSIVE METABOLIC PANEL - Abnormal; Notable for the following:    Glucose, Bld 116 (*)    Total Protein 8.5 (*)    All other components within normal limits  URINALYSIS, ROUTINE W REFLEX MICROSCOPIC (NOT AT [facility]) - Abnormal; Notable for the following:    APPearance CLOUDY (*)    Hgb urine dipstick MODERATE (*)    Leukocytes, UA LARGE (*)    All other components within normal limits  URINE MICROSCOPIC-ADD ON - Abnormal; Notable for the following:    Squamous Epithelial / LPF 6-30 (*)    Bacteria, UA MANY (*)    All other components within normal limits  LIPASE, BLOOD  CBC  URINALYSIS, ROUTINE W REFLEX MICROSCOPIC (NOT AT [facility])    Imaging Review Ct Head Wo Contrast  05/26/2015  CLINICAL DATA:  Acute onset of diffuse headache and nausea. Initial encounter. EXAM: CT HEAD WITHOUT CONTRAST TECHNIQUE: Contiguous axial images were obtained from the base of the skull through the vertex without intravenous contrast. COMPARISON:  CT of the head performed 05/05/2012 FINDINGS: There is no evidence of acute infarction, mass lesion, or intra- or extra-axial hemorrhage on CT. The posterior fossa, including the cerebellum, brainstem and fourth ventricle, is within normal limits. The third and  lateral ventricles, and basal ganglia are unremarkable in appearance. The cerebral hemispheres are symmetric in appearance, with normal gray-white differentiation. No mass effect or midline shift is seen. There is no evidence of fracture; visualized osseous structures are unremarkable in appearance. The visualized portions of the orbits are within normal limits. There is mild partial opacification of the left maxillary sinus. The remaining paranasal sinuses and mastoid air cells are well-aerated. No significant soft tissue abnormalities are seen. IMPRESSION: 1. No acute intracranial pathology seen on CT. 2. Mild partial opacification of the left maxillary sinus. Electronically Signed   By: Roanna Raider M.D.   On: 05/26/2015 22:04   I have personally reviewed and evaluated these images and lab results as part of my medical decision-making.   EKG Interpretation None      MDM   Final diagnoses:  Diarrhea, unspecified type  Bad headache    Patient's diarrhea is likely from gastroenteritis given her associated nausea as well. She appears well here, likely  mildly dehydrated. Given her headache as well, she was given IV fluids as well as treatment for her headache. Her headache is nonspecific and I have low suspicion for serious intracranial pathology such as subarachnoid hemorrhage, epidural/subdural hematoma, stroke, or meningitis/encephalitis. No meningismus on exam. Normal neuro exam. Has a low-grade fever but this is likely from the gastroenteritis. Abdominal exam is benign. Initial urinalysis shows UTI but is contaminated. Repeat urinalysis shows moderate leukocytes but no white blood cells on microscopy, no nitrites, and no bacteria. She repeatedly denies urine symptoms. I doubt she has UTI and after discussion with patient she agrees to hold off on antibiotics at this time and follow up with PCP.    Pricilla Loveless, MD 05/27/15 [phone]

## 2015-05-26 NOTE — ED Notes (Signed)
Pt refused in and out Cath

## 2015-05-26 NOTE — ED Notes (Signed)
Pt reports headache and diarrhea onset this morning 0300; pt denies v/a disturbance.

## 2015-05-27 MED ORDER — ONDANSETRON 4 MG PO TBDP: ORAL_TABLET | ORAL | Status: DC

## 2015-05-28 LAB — URINE CULTURE

## 2015-06-18 ENCOUNTER — Other Ambulatory Visit (INDEPENDENT_AMBULATORY_CARE_PROVIDER_SITE_OTHER): Payer: 59

## 2015-06-18 ENCOUNTER — Encounter: Payer: Self-pay | Admitting: Internal Medicine

## 2015-06-18 ENCOUNTER — Ambulatory Visit (INDEPENDENT_AMBULATORY_CARE_PROVIDER_SITE_OTHER): Payer: 59 | Admitting: Internal Medicine

## 2015-06-18 VITALS — BP 130/80 | HR 63 | Wt 137.0 lb

## 2015-06-18 DIAGNOSIS — R030 Elevated blood-pressure reading, without diagnosis of hypertension: Secondary | ICD-10-CM

## 2015-06-18 DIAGNOSIS — K529 Noninfective gastroenteritis and colitis, unspecified: Secondary | ICD-10-CM | POA: Insufficient documentation

## 2015-06-18 DIAGNOSIS — E559 Vitamin D deficiency, unspecified: Secondary | ICD-10-CM | POA: Diagnosis not present

## 2015-06-18 DIAGNOSIS — E538 Deficiency of other specified B group vitamins: Secondary | ICD-10-CM

## 2015-06-18 DIAGNOSIS — E119 Type 2 diabetes mellitus without complications: Secondary | ICD-10-CM | POA: Diagnosis not present

## 2015-06-18 DIAGNOSIS — IMO0001 Reserved for inherently not codable concepts without codable children: Secondary | ICD-10-CM

## 2015-06-18 LAB — BASIC METABOLIC PANEL
BUN: 10 mg/dL (ref 6–23)
CO2: 29 mEq/L (ref 19–32)
Calcium: 9.7 mg/dL (ref 8.4–10.5)
Chloride: 105 mEq/L (ref 96–112)
Creatinine, Ser: 0.62 mg/dL (ref 0.40–1.20)
GFR: 124.15 mL/min (ref >60.00)
GLUCOSE: 114 mg/dL — AB (ref 70–99)
Potassium: 4.1 mEq/L (ref 3.5–5.1)
SODIUM: 141 meq/L (ref 135–145)

## 2015-06-18 LAB — HEMOGLOBIN A1C: HEMOGLOBIN A1C: 6.9 % — AB (ref 4.6–6.5)

## 2015-06-18 MED ORDER — ONETOUCH DELICA LANCETS FINE MISC: 1.0000 | Freq: Every day | Status: DC | PRN

## 2015-06-18 MED ORDER — GLUCOSE BLOOD VI STRP: ORAL_STRIP | Status: DC

## 2015-06-18 NOTE — Progress Notes (Signed)
Subjective:  Patient ID: Sandra Petersen, female    DOB: Oct 04, 1950  Age: 65 y.o. MRN: [id number]  CC: No chief complaint on file.   HPI Sandra Petersen presents for DM2, elevated BP f/u. F/u recent gastroenteritis - resolved: ER notes reviewed  Outpatient Prescriptions Prior to Visit  Medication Sig Dispense Refill  . acetaminophen (TYLENOL) 325 MG tablet Take 650 mg by mouth every 6 (six) hours as needed (pain).    . Cholecalciferol (VITAMIN D3) 2000 UNITS capsule Take 1 capsule (2,000 Units total) by mouth daily. 100 capsule 3  . Cyanocobalamin (VITAMIN B-12) 1000 MCG SUBL Place 1 tablet (1,000 mcg total) under the tongue daily. 100 tablet 3  . diphenhydrAMINE (BENADRYL) 25 MG tablet Take 1 tablet (25 mg total) by mouth every 6 (six) hours as needed for itching (Rash). 30 tablet 0  . ergocalciferol (VITAMIN D2) 50000 UNITS capsule Take 1 capsule (50,000 Units total) by mouth once a week. 6 capsule 0  . hydrocortisone cream 1 % Apply to affected area 2 times daily 15 g 0  . meloxicam (MOBIC) 15 MG tablet Take 1 tablet (15 mg total) by mouth daily. 30 tablet 1  . metFORMIN (GLUCOPHAGE) 500 MG tablet Take 1 tablet (500 mg total) by mouth 2 (two) times daily with a meal. 60 tablet 11  . ondansetron (ZOFRAN ODT) 4 MG disintegrating tablet  ODT q4 hours prn nausea/vomit 10 tablet 0  . phenazopyridine (PYRIDIUM) 200 MG tablet Take 1 tablet (200 mg total) by mouth 3 (three) times daily as needed for pain. 10 tablet 0  . ciprofloxacin (CIPRO) 250 MG tablet Take 1 tablet (250 mg total) by mouth 2 (two) times daily. 8 tablet 0   No facility-administered medications prior to visit.    ROS Review of Systems  Constitutional: Negative for chills, activity change, appetite change, fatigue and unexpected weight change.  HENT: Negative for congestion, mouth sores and sinus pressure.   Eyes: Negative for visual disturbance.  Respiratory: Negative for cough and chest tightness.     Cardiovascular: Negative for leg swelling.  Gastrointestinal: Negative for nausea, vomiting, abdominal pain, diarrhea, abdominal distention and anal bleeding.  Genitourinary: Negative for frequency, difficulty urinating and vaginal pain.  Musculoskeletal: Negative for myalgias, back pain and gait problem.  Skin: Negative for pallor and rash.  Neurological: Negative for dizziness, tremors, weakness, numbness and headaches.  Psychiatric/Behavioral: Negative for suicidal ideas, confusion and sleep disturbance.    Objective:  BP 130/80 mmHg  Pulse 63  Wt 137 lb (62.143 kg)  SpO2 98%  BP Readings from Last 3 Encounters:  06/18/15 130/80  05/27/15 108/55  12/18/14 140/78    Wt Readings from Last 3 Encounters:  06/18/15 137 lb (62.143 kg)  12/18/14 139 lb (63.05 kg)  01/09/14 136 lb 6.4 oz (61.871 kg)    Physical Exam  Constitutional: She appears well-developed. No distress.  HENT:  Head: Normocephalic.  Right Ear: External ear normal.  Left Ear: External ear normal.  Nose: Nose normal.  Mouth/Throat: Oropharynx is clear and moist.  Eyes: Conjunctivae are normal. Pupils are equal, round, and reactive to light. Right eye exhibits no discharge. Left eye exhibits no discharge.  Neck: Normal range of motion. Neck supple. No JVD present. No tracheal deviation present. No thyromegaly present.  Cardiovascular: Normal rate, regular rhythm and normal heart sounds.   Pulmonary/Chest: No stridor. No respiratory distress. She has no wheezes.  Abdominal: Soft. Bowel sounds are normal. She exhibits no distension and no  mass. There is no tenderness. There is no rebound and no guarding.  Musculoskeletal: She exhibits no edema or tenderness.  Lymphadenopathy:    She has no cervical adenopathy.  Neurological: She displays normal reflexes. No cranial nerve deficit. She exhibits normal muscle tone. Coordination normal.  Skin: No rash noted. No erythema.  Psychiatric: She has a normal mood and  affect. Her behavior is normal. Judgment and thought content normal.    Lab Results  Component Value Date   WBC 8.1 05/26/2015   HGB 12.6 05/26/2015   HCT 39.8 05/26/2015   PLT 243 05/26/2015   GLUCOSE 116* 05/26/2015   CHOL 145 12/23/2014   TRIG 58.0 12/23/2014   HDL 46.00 12/23/2014   LDLCALC 87 12/23/2014   ALT 22 05/26/2015   AST 23 05/26/2015   NA 139 05/26/2015   K 4.1 05/26/2015   CL 102 05/26/2015   CREATININE 0.74 05/26/2015   BUN 12 05/26/2015   CO2 28 05/26/2015   TSH 4.07 12/23/2014   HGBA1C 6.9* 12/23/2014   MICROALBUR 1.1 12/23/2014    Ct Head Wo Contrast  05/26/2015  CLINICAL DATA:  Acute onset of diffuse headache and nausea. Initial encounter. EXAM: CT HEAD WITHOUT CONTRAST TECHNIQUE: Contiguous axial images were obtained from the base of the skull through the vertex without intravenous contrast. COMPARISON:  CT of the head performed 05/05/2012 FINDINGS: There is no evidence of acute infarction, mass lesion, or intra- or extra-axial hemorrhage on CT. The posterior fossa, including the cerebellum, brainstem and fourth ventricle, is within normal limits. The third and lateral ventricles, and basal ganglia are unremarkable in appearance. The cerebral hemispheres are symmetric in appearance, with normal gray-white differentiation. No mass effect or midline shift is seen. There is no evidence of fracture; visualized osseous structures are unremarkable in appearance. The visualized portions of the orbits are within normal limits. There is mild partial opacification of the left maxillary sinus. The remaining paranasal sinuses and mastoid air cells are well-aerated. No significant soft tissue abnormalities are seen. IMPRESSION: 1. No acute intracranial pathology seen on CT. 2. Mild partial opacification of the left maxillary sinus. Electronically Signed   By: Sandra Petersen M.D.   On: 05/26/2015 22:04    Assessment & Plan:   There are no diagnoses linked to this encounter. I  have discontinued Sandra Petersen's ciprofloxacin. I am also having her maintain her acetaminophen, meloxicam, phenazopyridine, diphenhydrAMINE, hydrocortisone cream, metFORMIN, Vitamin B-12, ergocalciferol, Vitamin D3, and ondansetron.  No orders of the defined types were placed in this encounter.     Follow-up: No Follow-up on file.  Sonda Primes, MD

## 2015-06-18 NOTE — Assessment & Plan Note (Signed)
On Vit D 

## 2015-06-18 NOTE — Assessment & Plan Note (Signed)
BP Readings from Last 3 Encounters:  06/18/15 130/80  05/27/15 108/55  12/18/14 140/78

## 2015-06-18 NOTE — Progress Notes (Signed)
Pre visit review using our clinic review tool, if applicable. No additional management support is needed unless otherwise documented below in the visit note. 

## 2015-06-18 NOTE — Assessment & Plan Note (Signed)
On B12 

## 2015-06-18 NOTE — Assessment & Plan Note (Signed)
Labs New Glucometer

## 2015-06-18 NOTE — Assessment & Plan Note (Signed)
resolved 

## 2015-10-15 ENCOUNTER — Other Ambulatory Visit (INDEPENDENT_AMBULATORY_CARE_PROVIDER_SITE_OTHER): Payer: 59

## 2015-10-15 ENCOUNTER — Encounter: Payer: Self-pay | Admitting: Internal Medicine

## 2015-10-15 ENCOUNTER — Ambulatory Visit (INDEPENDENT_AMBULATORY_CARE_PROVIDER_SITE_OTHER): Payer: 59 | Admitting: Internal Medicine

## 2015-10-15 VITALS — BP 160/88 | HR 67 | Wt 140.0 lb

## 2015-10-15 DIAGNOSIS — R030 Elevated blood-pressure reading, without diagnosis of hypertension: Secondary | ICD-10-CM

## 2015-10-15 DIAGNOSIS — E559 Vitamin D deficiency, unspecified: Secondary | ICD-10-CM | POA: Diagnosis not present

## 2015-10-15 DIAGNOSIS — E538 Deficiency of other specified B group vitamins: Secondary | ICD-10-CM

## 2015-10-15 DIAGNOSIS — IMO0001 Reserved for inherently not codable concepts without codable children: Secondary | ICD-10-CM

## 2015-10-15 DIAGNOSIS — E119 Type 2 diabetes mellitus without complications: Secondary | ICD-10-CM | POA: Diagnosis not present

## 2015-10-15 LAB — HEMOGLOBIN A1C: Hgb A1c MFr Bld: 6.7 % — ABNORMAL HIGH (ref 4.6–6.5)

## 2015-10-15 LAB — BASIC METABOLIC PANEL
BUN: 12 mg/dL (ref 6–23)
CHLORIDE: 103 meq/L (ref 96–112)
CO2: 31 meq/L (ref 19–32)
Calcium: 9.5 mg/dL (ref 8.4–10.5)
Creatinine, Ser: 0.66 mg/dL (ref 0.40–1.20)
GFR: 115.39 mL/min (ref >60.00)
GLUCOSE: 139 mg/dL — AB (ref 70–99)
POTASSIUM: 3.7 meq/L (ref 3.5–5.1)
SODIUM: 140 meq/L (ref 135–145)

## 2015-10-15 MED ORDER — LOSARTAN POTASSIUM 50 MG PO TABS: 50.0000 mg | ORAL_TABLET | Freq: Every day | ORAL | Status: DC

## 2015-10-15 NOTE — Assessment & Plan Note (Signed)
Labs

## 2015-10-15 NOTE — Assessment & Plan Note (Signed)
Risks associated with treatment noncompliance were discussed. Compliance was encouraged. 

## 2015-10-15 NOTE — Patient Instructions (Signed)
Cooking With Less Salt Cooking with less salt is one way to reduce the amount of sodium you get from food. Sodium raises blood pressure and causes water to be held in the body. Getting less sodium from food may help lower your blood pressure, reduce any swelling, and protect your heart, liver, and kidneys.  WHAT DO I NEED TO KNOW ABOUT COOKING WITH LESS SALT?  Buy sodium-free or low-sodium products. Look on the label for the words:   Lower-sodium.  Sodium-free.   Sodium-reduced.   No salt added.   Unsalted.  Check the food label before using or buying packaged ingredients.  Look for products with no more than 150 mg of sodium in one serving.  Do not choose foods with salt as one of the first three ingredients on the ingredients list. If salt is one of the first three ingredients, it usually means the item is high in sodium because ingredients are listed in order of amount in the food item.  Use herbs, seasonings without salt, and spices as substitutes for salt in foods.  Use sodium-free baking soda when baking. WHAT ARE SOME SALT ALTERNATIVES? The following are herbs, seasonings, and spices that can be used instead of salt to give taste to your food. Next to their names are foods they can be used to flavor.  Herbs  Bay-Soups, meat and vegetable dishes, and spaghetti sauce.   Basil-Italian dishes, soups, pasta, and fish dishes.   Cilantro-Meat, poultry, and vegetable dishes.   Chili Powder-Marinades and Mexican dishes.   Chives-Salad dressings and potato dishes.   Cumin-Mexican dishes, couscous, and meat dishes.   Dill-Fish dishes, sauces, and salads.   Fennel-Meat and vegetable dishes, breads, and cookies.   Garlic (do not use garlic salt)-Italian dishes, meat dishes, salad dressings, and sauces.   Marjoram-Soups, potato dishes, and meat dishes.   Oregano-Pizza and spaghetti sauce.   Parsley-Salads, soups, pasta, and meat dishes.    Rosemary-Italian dishes, salad dressings, soups, and red meats.   Saffron-Fish dishes, pasta, and some poultry dishes.   Sage-Stuffings and sauces.   Tarragon-Fish and [employer].   Thyme-Stuffing, meat, and fish dishes.  Herbs should be fresh or dried. Do not choose packaged mixes.  Seasonings  Lemon juice-Fish dishes, poultry dishes, vegetables, and salads.   Vinegar-Salad dressings, vegetables, and fish dishes.  Spices  Cinnamon-Sweet dishes (such as cakes, cookies, and puddings).   Cloves-Gingerbread, puddings, and marinades for meats.   Curry-Vegetable dishes, fish and poultry dishes, and stir-fry dishes.   Ginger-Vegetables dishes, fish dishes, and stir-fry dishes.   Nutmeg-Pasta, vegetables, poultry, fish dishes, and custard.  WHAT ARE SOME LOW-SODIUM INGREDIENTS AND FOODS?  Fresh or frozen fruits and vegetables with no sauce added.  Fresh or frozen whole meats, poultry, and fish with no sauce added.  Eggs.  Noodles, pasta, quinoa, rice.  Shredded or puffed wheat or puffed rice.  Regular or quick oats.  Milk, yogurt, low-sodium cheeses and hard cheeses (such as cheddar, [employer], or mozzarella). Always check the label for serving size and sodium content.  Unsalted butter or margarine.  Unsalted nuts.  Sherbet or ice cream (keep to  cup serving).  Homemade pudding.  Sodium-free baking soda and baking powder. This is not a complete list of low-sodium ingredients and foods. Contact your dietitian for more options.  WHAT HIGH-SODIUM INGREDIENTS ARE NOT RECOMMENDED?  Sauces, such as mustard, barbecue sauce, soy sauce, teriyaki sauce, steak sauce, chili sauce, cocktail sauce, and tartar sauce.  Mixes, such as  flavored rice.  Instant products, such as ready-made pasta.  Horseradish.  Salsa.   Packaged gravies.   Rosita FirePickles.  Olives.  Sauerkraut.  Salted nuts.   Cured or smoked meats (such as hot dogs, bacon,  salami, ham, and bologna).   Processed vegetable juices, such as tomato juice.  Buttermilk.  Processed cheeses (such as cheese dips or cheese spread).  Cottage cheese.  Instant hot cereals.  Dessert mixes (ready-to-make) and store-bought cakes and pies.  Crackers with salted tops. This is not a complete list of high-sodium ingredients. Contact your dietitian for more options.    This information is not intended to replace advice given to you by your health care provider. Make sure you discuss any questions you have with your health care provider.   Document Released: 04/26/2005 Document Revised: 05/17/2014 Document Reviewed: 03/19/2013 Elsevier Interactive Patient Education 2016 [employer]    Hypertension Hypertension, commonly called high blood pressure, is when the force of blood pumping through your arteries is too strong. Your arteries are the blood vessels that carry blood from your heart throughout your body. A blood pressure reading consists of a higher number over a lower number, such as 110/72. The higher number (systolic) is the pressure inside your arteries when your heart pumps. The lower number (diastolic) is the pressure inside your arteries when your heart relaxes. Ideally you want your blood pressure below 120/80. Hypertension forces your heart to work harder to pump blood. Your arteries may become narrow or stiff. Having untreated or uncontrolled hypertension can cause heart attack, stroke, kidney disease, and other problems. RISK FACTORS Some risk factors for high blood pressure are controllable. Others are not.  Risk factors you cannot control include:   Race. You may be at higher risk if you are African American.  Age. Risk increases with age.  Gender. Men are at higher risk than women before age 66 years. After age 66, women are at higher risk than men. Risk factors you can control include:  Not getting enough exercise or physical activity.  Being  overweight.  Getting too much fat, sugar, calories, or salt in your diet.  Drinking too much alcohol. SIGNS AND SYMPTOMS Hypertension does not usually cause signs or symptoms. Extremely high blood pressure (hypertensive crisis) may cause headache, anxiety, shortness of breath, and nosebleed. DIAGNOSIS To check if you have hypertension, your health care provider will measure your blood pressure while you are seated, with your arm held at the level of your heart. It should be measured at least twice using the same arm. Certain conditions can cause a difference in blood pressure between your right and left arms. A blood pressure reading that is higher than normal on one occasion does not mean that you need treatment. If it is not clear whether you have high blood pressure, you may be asked to return on a different day to have your blood pressure checked again. Or, you may be asked to monitor your blood pressure at home for 1 or more weeks. TREATMENT Treating high blood pressure includes making lifestyle changes and possibly taking medicine. Living a healthy lifestyle can help lower high blood pressure. You may need to change some of your habits. Lifestyle changes may include:  Following the DASH diet. This diet is high in fruits, vegetables, and whole grains. It is low in salt, red meat, and added sugars.  Keep your sodium intake below 2,300 mg per day.  Getting at least 30-45 minutes of aerobic exercise at least 4  times per week.  Losing weight if necessary.  Not smoking.  Limiting alcoholic beverages.  Learning ways to reduce stress. Your health care provider may prescribe medicine if lifestyle changes are not enough to get your blood pressure under control, and if one of the following is true:  You are 36-81 years of age and your systolic blood pressure is above 140.  You are 79 years of age or older, and your systolic blood pressure is above 150.  Your diastolic blood pressure is  above 90.  You have diabetes, and your systolic blood pressure is over 140 or your diastolic blood pressure is over 90.  You have kidney disease and your blood pressure is above 140/90.  You have heart disease and your blood pressure is above 140/90. Your personal target blood pressure may vary depending on your medical conditions, your age, and other factors. HOME CARE INSTRUCTIONS  Have your blood pressure rechecked as directed by your health care provider.   Take medicines only as directed by your health care provider. Follow the directions carefully. Blood pressure medicines must be taken as prescribed. The medicine does not work as well when you skip doses. Skipping doses also puts you at risk for problems.  Do not smoke.   Monitor your blood pressure at home as directed by your health care provider. SEEK MEDICAL CARE IF:   You think you are having a reaction to medicines taken.  You have recurrent headaches or feel dizzy.  You have swelling in your ankles.  You have trouble with your vision. SEEK IMMEDIATE MEDICAL CARE IF:  You develop a severe headache or confusion.  You have unusual weakness, numbness, or feel faint.  You have severe chest or abdominal pain.  You vomit repeatedly.  You have trouble breathing. MAKE SURE YOU:   Understand these instructions.  Will watch your condition.  Will get help right away if you are not doing well or get worse.   This information is not intended to replace advice given to you by your health care provider. Make sure you discuss any questions you have with your health care provider.   Document Released: 04/26/2005 Document Revised: 09/10/2014 Document Reviewed: 02/16/2013 Elsevier Interactive Patient Education [employer]

## 2015-10-15 NOTE — Progress Notes (Signed)
Pre visit review using our clinic review tool, if applicable. No additional management support is needed unless otherwise documented below in the visit note. 

## 2015-10-15 NOTE — Assessment & Plan Note (Addendum)
BP Readings from Last 3 Encounters:  10/15/15 160/88  06/18/15 130/80  05/27/15 108/55  start Losartan

## 2015-10-15 NOTE — Progress Notes (Signed)
Subjective:  Patient ID: Sandra Petersen, female    DOB: 1950/06/12  Age: 65 y.o. MRN: [id number]  CC: No chief complaint on file.   HPI ELODIE PANAMENO presents for DM, B12 def, Vit D def f/u  Outpatient Prescriptions Prior to Visit  Medication Sig Dispense Refill  . acetaminophen (TYLENOL) 325 MG tablet Take 650 mg by mouth every 6 (six) hours as needed (pain).    . Cholecalciferol (VITAMIN D3) 2000 UNITS capsule Take 1 capsule (2,000 Units total) by mouth daily. 100 capsule 3  . Cyanocobalamin (VITAMIN B-12) 1000 MCG SUBL Place 1 tablet (1,000 mcg total) under the tongue daily. 100 tablet 3  . diphenhydrAMINE (BENADRYL) 25 MG tablet Take 1 tablet (25 mg total) by mouth every 6 (six) hours as needed for itching (Rash). 30 tablet 0  . ergocalciferol (VITAMIN D2) 50000 UNITS capsule Take 1 capsule (50,000 Units total) by mouth once a week. 6 capsule 0  . glucose blood (ONETOUCH VERIO) test strip Use as instructed 50 each 11  . hydrocortisone cream 1 % Apply to affected area 2 times daily 15 g 0  . meloxicam (MOBIC) 15 MG tablet Take 1 tablet (15 mg total) by mouth daily. 30 tablet 1  . metFORMIN (GLUCOPHAGE) 500 MG tablet Take 1 tablet (500 mg total) by mouth 2 (two) times daily with a meal. 60 tablet 11  . ondansetron (ZOFRAN ODT) 4 MG disintegrating tablet  ODT q4 hours prn nausea/vomit 10 tablet 0  . ONETOUCH DELICA LANCETS FINE MISC 1 Device by Does not apply route daily as needed. 100 each 3  . phenazopyridine (PYRIDIUM) 200 MG tablet Take 1 tablet (200 mg total) by mouth 3 (three) times daily as needed for pain. 10 tablet 0   No facility-administered medications prior to visit.    ROS Review of Systems  Constitutional: Positive for fatigue. Negative for chills, activity change, appetite change and unexpected weight change.  HENT: Negative for congestion, mouth sores and sinus pressure.   Eyes: Negative for visual disturbance.  Respiratory: Negative for cough and chest  tightness.   Gastrointestinal: Negative for nausea and abdominal pain.  Genitourinary: Negative for frequency, difficulty urinating and vaginal pain.  Musculoskeletal: Negative for back pain and gait problem.  Skin: Negative for pallor and rash.  Neurological: Negative for dizziness, tremors, weakness, numbness and headaches.  Psychiatric/Behavioral: Negative for confusion and sleep disturbance.    Objective:  BP 160/88 mmHg  Pulse 67  Wt 140 lb (63.504 kg)  SpO2 94%  BP Readings from Last 3 Encounters:  10/15/15 160/88  06/18/15 130/80  05/27/15 108/55    Wt Readings from Last 3 Encounters:  10/15/15 140 lb (63.504 kg)  06/18/15 137 lb (62.143 kg)  12/18/14 139 lb (63.05 kg)    Physical Exam  Constitutional: She appears well-developed. No distress.  HENT:  Head: Normocephalic.  Right Ear: External ear normal.  Left Ear: External ear normal.  Nose: Nose normal.  Mouth/Throat: Oropharynx is clear and moist.  Eyes: Conjunctivae are normal. Pupils are equal, round, and reactive to light. Right eye exhibits no discharge. Left eye exhibits no discharge.  Neck: Normal range of motion. Neck supple. No JVD present. No tracheal deviation present. No thyromegaly present.  Cardiovascular: Normal rate, regular rhythm and normal heart sounds.   Pulmonary/Chest: No stridor. No respiratory distress. She has no wheezes.  Abdominal: Soft. Bowel sounds are normal. She exhibits no distension and no mass. There is no tenderness. There is no rebound  and no guarding.  Musculoskeletal: She exhibits no edema or tenderness.  Lymphadenopathy:    She has no cervical adenopathy.  Neurological: She displays normal reflexes. No cranial nerve deficit. She exhibits normal muscle tone. Coordination normal.  Skin: No rash noted. No erythema.  Psychiatric: She has a normal mood and affect. Her behavior is normal. Judgment and thought content normal.    Lab Results  Component Value Date   WBC 8.1  05/26/2015   HGB 12.6 05/26/2015   HCT 39.8 05/26/2015   PLT 243 05/26/2015   GLUCOSE 114* 06/18/2015   CHOL 145 12/23/2014   TRIG 58.0 12/23/2014   HDL 46.00 12/23/2014   LDLCALC 87 12/23/2014   ALT 22 05/26/2015   AST 23 05/26/2015   NA 141 06/18/2015   K 4.1 06/18/2015   CL 105 06/18/2015   CREATININE 0.62 06/18/2015   BUN 10 06/18/2015   CO2 29 06/18/2015   TSH 4.07 12/23/2014   HGBA1C 6.9* 06/18/2015   MICROALBUR 1.1 12/23/2014    Ct Head Wo Contrast  05/26/2015  CLINICAL DATA:  Acute onset of diffuse headache and nausea. Initial encounter. EXAM: CT HEAD WITHOUT CONTRAST TECHNIQUE: Contiguous axial images were obtained from the base of the skull through the vertex without intravenous contrast. COMPARISON:  CT of the head performed 05/05/2012 FINDINGS: There is no evidence of acute infarction, mass lesion, or intra- or extra-axial hemorrhage on CT. The posterior fossa, including the cerebellum, brainstem and fourth ventricle, is within normal limits. The third and lateral ventricles, and basal ganglia are unremarkable in appearance. The cerebral hemispheres are symmetric in appearance, with normal gray-white differentiation. No mass effect or midline shift is seen. There is no evidence of fracture; visualized osseous structures are unremarkable in appearance. The visualized portions of the orbits are within normal limits. There is mild partial opacification of the left maxillary sinus. The remaining paranasal sinuses and mastoid air cells are well-aerated. No significant soft tissue abnormalities are seen. IMPRESSION: 1. No acute intracranial pathology seen on CT. 2. Mild partial opacification of the left maxillary sinus. Electronically Signed   By: Roanna RaiderJeffery  Chang M.D.   On: 05/26/2015 22:04    Assessment & Plan:   There are no diagnoses linked to this encounter. I am having Ms. Sobczyk maintain her acetaminophen, meloxicam, phenazopyridine, diphenhydrAMINE, hydrocortisone cream,  metFORMIN, Vitamin B-12, ergocalciferol, Vitamin D3, ondansetron, glucose blood, and ONETOUCH DELICA LANCETS FINE.  No orders of the defined types were placed in this encounter.     Follow-up: No Follow-up on file.  Sonda PrimesAlex Kla Bily, MD

## 2016-02-18 ENCOUNTER — Ambulatory Visit (INDEPENDENT_AMBULATORY_CARE_PROVIDER_SITE_OTHER): Payer: 59 | Admitting: Internal Medicine

## 2016-02-18 ENCOUNTER — Encounter: Payer: Self-pay | Admitting: Internal Medicine

## 2016-02-18 ENCOUNTER — Ambulatory Visit (INDEPENDENT_AMBULATORY_CARE_PROVIDER_SITE_OTHER)
Admission: RE | Admit: 2016-02-18 | Discharge: 2016-02-18 | Disposition: A | Discharge status: Home / Self Care | Payer: 59 | Source: Ambulatory Visit | Attending: Internal Medicine | Admitting: Internal Medicine

## 2016-02-18 VITALS — BP 150/80 | HR 69 | Wt 142.0 lb

## 2016-02-18 DIAGNOSIS — E538 Deficiency of other specified B group vitamins: Secondary | ICD-10-CM | POA: Diagnosis not present

## 2016-02-18 DIAGNOSIS — R1032 Left lower quadrant pain: Secondary | ICD-10-CM | POA: Insufficient documentation

## 2016-02-18 DIAGNOSIS — E559 Vitamin D deficiency, unspecified: Secondary | ICD-10-CM

## 2016-02-18 DIAGNOSIS — I10 Essential (primary) hypertension: Secondary | ICD-10-CM | POA: Diagnosis not present

## 2016-02-18 MED ORDER — MELOXICAM 7.5 MG PO TABS: 7.5000 mg | ORAL_TABLET | Freq: Every day | ORAL | 1 refills | Status: DC

## 2016-02-18 MED ORDER — LOSARTAN POTASSIUM 100 MG PO TABS: 100.0000 mg | ORAL_TABLET | Freq: Every day | ORAL | 11 refills | Status: DC

## 2016-02-18 NOTE — Assessment & Plan Note (Signed)
On Vit D 

## 2016-02-18 NOTE — Assessment & Plan Note (Signed)
Re-start Losartan 

## 2016-02-18 NOTE — Progress Notes (Signed)
Pre visit review using our clinic review tool, if applicable. No additional management support is needed unless otherwise documented below in the visit note. 

## 2016-02-18 NOTE — Assessment & Plan Note (Signed)
On B12 

## 2016-02-18 NOTE — Progress Notes (Signed)
Subjective:  Patient ID: Sandra Petersen, female    DOB: 02-Nov-1950  Age: 65 y.o. MRN: [id number]  CC: No chief complaint on file.   HPI Sandra Pewheresa G Yawn presents for HTN, DM, B12 def f/u. C/o L groin pain x  1.5 weeks - worse w/sitting and getting up. Walking makes it worse. Constant, pain ids 8/10 at times... Lost Losartan Rx 2 wks ago  Outpatient Medications Prior to Visit  Medication Sig Dispense Refill  . acetaminophen (TYLENOL) 325 MG tablet Take 650 mg by mouth every 6 (six) hours as needed (pain).    . Cyanocobalamin (VITAMIN B-12) 1000 MCG SUBL Place 1 tablet (1,000 mcg total) under the tongue daily. 100 tablet 3  . diphenhydrAMINE (BENADRYL) 25 MG tablet Take 1 tablet (25 mg total) by mouth every 6 (six) hours as needed for itching (Rash). 30 tablet 0  . glucose blood (ONETOUCH VERIO) test strip Use as instructed 50 each 11  . hydrocortisone cream 1 % Apply to affected area 2 times daily 15 g 0  . losartan (COZAAR) 50 MG tablet Take 1 tablet (50 mg total) by mouth daily. 30 tablet 11  . metFORMIN (GLUCOPHAGE) 500 MG tablet Take 1 tablet (500 mg total) by mouth 2 (two) times daily with a meal. 60 tablet 11  . ONETOUCH DELICA LANCETS FINE MISC 1 Device by Does not apply route daily as needed. 100 each 3   No facility-administered medications prior to visit.     ROS Review of Systems  Constitutional: Negative for activity change, appetite change, chills, fatigue and unexpected weight change.  HENT: Negative for congestion, mouth sores and sinus pressure.   Eyes: Negative for visual disturbance.  Respiratory: Negative for cough and chest tightness.   Gastrointestinal: Negative for abdominal pain and nausea.  Genitourinary: Negative for difficulty urinating, frequency and vaginal pain.  Musculoskeletal: Positive for arthralgias. Negative for back pain and gait problem.  Skin: Negative for pallor and rash.  Neurological: Negative for dizziness, tremors, weakness, numbness  and headaches.  Psychiatric/Behavioral: Negative for confusion and sleep disturbance.    Objective:  BP (!) 150/80   Pulse 69   Wt 142 lb (64.4 kg)   SpO2 98%   BMI 25.97 kg/m   BP Readings from Last 3 Encounters:  02/18/16 (!) 150/80  10/15/15 (!) 160/88  06/18/15 130/80    Wt Readings from Last 3 Encounters:  02/18/16 142 lb (64.4 kg)  10/15/15 140 lb (63.5 kg)  06/18/15 137 lb (62.1 kg)    Physical Exam  Constitutional: She appears well-developed. No distress.  HENT:  Head: Normocephalic.  Right Ear: External ear normal.  Left Ear: External ear normal.  Nose: Nose normal.  Mouth/Throat: Oropharynx is clear and moist.  Eyes: Conjunctivae are normal. Pupils are equal, round, and reactive to light. Right eye exhibits no discharge. Left eye exhibits no discharge.  Neck: Normal range of motion. Neck supple. No JVD present. No tracheal deviation present. No thyromegaly present.  Cardiovascular: Normal rate, regular rhythm and normal heart sounds.   Pulmonary/Chest: No stridor. No respiratory distress. She has no wheezes.  Abdominal: Soft. Bowel sounds are normal. She exhibits no distension and no mass. There is no tenderness. There is no rebound and no guarding.  Musculoskeletal: She exhibits tenderness. She exhibits no edema.  Lymphadenopathy:    She has no cervical adenopathy.  Neurological: She displays normal reflexes. No cranial nerve deficit. She exhibits normal muscle tone. Coordination abnormal.  Skin: No rash noted. No  erythema.  Psychiatric: She has a normal mood and affect. Her behavior is normal. Judgment and thought content normal.  L groin is tender, no mass L hip - tender w/ROM  Lab Results  Component Value Date   WBC 8.1 05/26/2015   HGB 12.6 05/26/2015   HCT 39.8 05/26/2015   PLT 243 05/26/2015   GLUCOSE 139 (H) 10/15/2015   CHOL 145 12/23/2014   TRIG 58.0 12/23/2014   HDL 46.00 12/23/2014   LDLCALC 87 12/23/2014   ALT 22 05/26/2015   AST 23  05/26/2015   NA 140 10/15/2015   K 3.7 10/15/2015   CL 103 10/15/2015   CREATININE 0.66 10/15/2015   BUN 12 10/15/2015   CO2 31 10/15/2015   TSH 4.07 12/23/2014   HGBA1C 6.7 (H) 10/15/2015   MICROALBUR 1.1 12/23/2014    Ct Head Wo Contrast  Result Date: 05/26/2015 CLINICAL DATA:  Acute onset of diffuse headache and nausea. Initial encounter. EXAM: CT HEAD WITHOUT CONTRAST TECHNIQUE: Contiguous axial images were obtained from the base of the skull through the vertex without intravenous contrast. COMPARISON:  CT of the head performed 05/05/2012 FINDINGS: There is no evidence of acute infarction, mass lesion, or intra- or extra-axial hemorrhage on CT. The posterior fossa, including the cerebellum, brainstem and fourth ventricle, is within normal limits. The third and lateral ventricles, and basal ganglia are unremarkable in appearance. The cerebral hemispheres are symmetric in appearance, with normal gray-white differentiation. No mass effect or midline shift is seen. There is no evidence of fracture; visualized osseous structures are unremarkable in appearance. The visualized portions of the orbits are within normal limits. There is mild partial opacification of the left maxillary sinus. The remaining paranasal sinuses and mastoid air cells are well-aerated. No significant soft tissue abnormalities are seen. IMPRESSION: 1. No acute intracranial pathology seen on CT. 2. Mild partial opacification of the left maxillary sinus. Electronically Signed   By: Roanna Raider M.D.   On: 05/26/2015 22:04    Assessment & Plan:   There are no diagnoses linked to this encounter. I am having Ms. Treece maintain her acetaminophen, diphenhydrAMINE, hydrocortisone cream, metFORMIN, Vitamin B-12, glucose blood, ONETOUCH DELICA LANCETS FINE, and losartan.  No orders of the defined types were placed in this encounter.    Follow-up: No Follow-up on file.  Sonda Primes, MD

## 2016-02-18 NOTE — Assessment & Plan Note (Addendum)
10/17 ?MSK starin Meloxicam Hip X ray RTC soon if not better

## 2016-05-19 ENCOUNTER — Ambulatory Visit: Payer: 59 | Admitting: Internal Medicine

## 2016-06-10 ENCOUNTER — Emergency Department (HOSPITAL_COMMUNITY)
Admission: EM | Admit: 2016-06-10 | Discharge: 2016-06-10 | Disposition: A | Discharge status: Home / Self Care | Payer: Medicare Other | Attending: Emergency Medicine | Admitting: Emergency Medicine

## 2016-06-10 ENCOUNTER — Emergency Department (HOSPITAL_COMMUNITY): Payer: Medicare Other

## 2016-06-10 ENCOUNTER — Encounter (HOSPITAL_COMMUNITY): Payer: Self-pay | Admitting: Emergency Medicine

## 2016-06-10 DIAGNOSIS — Z7984 Long term (current) use of oral hypoglycemic drugs: Secondary | ICD-10-CM | POA: Diagnosis not present

## 2016-06-10 DIAGNOSIS — R69 Illness, unspecified: Secondary | ICD-10-CM

## 2016-06-10 DIAGNOSIS — J111 Influenza due to unidentified influenza virus with other respiratory manifestations: Secondary | ICD-10-CM | POA: Insufficient documentation

## 2016-06-10 DIAGNOSIS — R05 Cough: Secondary | ICD-10-CM | POA: Diagnosis present

## 2016-06-10 DIAGNOSIS — R079 Chest pain, unspecified: Secondary | ICD-10-CM | POA: Diagnosis not present

## 2016-06-10 DIAGNOSIS — E119 Type 2 diabetes mellitus without complications: Secondary | ICD-10-CM | POA: Insufficient documentation

## 2016-06-10 LAB — BASIC METABOLIC PANEL
ANION GAP: 10 (ref 5–15)
BUN: 11 mg/dL (ref 6–20)
CALCIUM: 9.6 mg/dL (ref 8.9–10.3)
CO2: 26 mmol/L (ref 22–32)
Chloride: 100 mmol/L — ABNORMAL LOW (ref 101–111)
Creatinine, Ser: 0.69 mg/dL (ref 0.44–1.00)
GFR calc Af Amer: >60 mL/min (ref >60)
Glucose, Bld: 142 mg/dL — ABNORMAL HIGH (ref 65–99)
POTASSIUM: 4.2 mmol/L (ref 3.5–5.1)
SODIUM: 136 mmol/L (ref 135–145)

## 2016-06-10 LAB — CBC
HEMATOCRIT: 38.5 % (ref 36.0–46.0)
Hemoglobin: 12.3 g/dL (ref 12.0–15.0)
MCH: 29.9 pg (ref 26.0–34.0)
MCHC: 31.9 g/dL (ref 30.0–36.0)
MCV: 93.4 fL (ref 78.0–100.0)
Platelets: 227 10*3/uL (ref 150–400)
RBC: 4.12 MIL/uL (ref 3.87–5.11)
RDW: 12.9 % (ref 11.5–15.5)
WBC: 8.6 10*3/uL (ref 4.0–10.5)

## 2016-06-10 LAB — I-STAT TROPONIN, ED
TROPONIN I, POC: 0 ng/mL (ref 0.00–0.08)
TROPONIN I, POC: 0 ng/mL (ref 0.00–0.08)

## 2016-06-10 LAB — INFLUENZA PANEL BY PCR (TYPE A & B)
INFLBPCR: NEGATIVE
Influenza A By PCR: POSITIVE — AB

## 2016-06-10 LAB — I-STAT CG4 LACTIC ACID, ED: Lactic Acid, Venous: 0.86 mmol/L (ref 0.5–1.9)

## 2016-06-10 MED ORDER — ACETAMINOPHEN 325 MG PO TABS
650.0000 mg | ORAL_TABLET | Freq: Once | ORAL | Status: AC | PRN
  Administered 2016-06-10: 650 mg via ORAL
  Filled 2016-06-10: qty 2

## 2016-06-10 NOTE — ED Triage Notes (Signed)
Pt report cough, body aches and CP worse with coughing since yesterday.

## 2016-06-10 NOTE — ED Provider Notes (Signed)
[facility]-EMER[facility]ENCY DEPT Provider Note   CSN: [id number] Arrival date & time: 06/10/16 4098     History    Chief Complaint  Patient presents with  . Cough  . Chest Pain     HPI Sandra Petersen is a 66 y.o. female.  66yo F w/ PMH including T2DM, HTN who p/w Cough, body aches, and chest pain. The patient began feeling sick 3-4 days ago with dry cough, mild headache, and body aches. Her symptoms have persisted. She recently has begun having chest pain only with coughing. No chest pain at rest. She endorses associated nasal congestion and fevers starting today. No shortness of breath, diaphoresis, nausea, vomiting, diarrhea, or rash. No sick contacts or recent travel. She has taken Tylenol last night as well as Mucinex. No history of blood clots, OCP use, or history of cancer. No leg swelling or pain.   Past Medical History:  Diagnosis Date  . Chronic headaches   . Diabetes mellitus without complication [facility])      Patient Active Problem List   Diagnosis Date Noted  . Groin pain, left 02/18/2016  . Gastroenteritis 06/18/2015  . B12 deficiency 12/23/2014  . Paresthesia 12/18/2014  . Lateral epicondylitis (tennis elbow) 01/09/2014  . Bursitis of shoulder, right 09/12/2013  . Well adult exam 09/05/2013  . Biceps tendonitis on right 09/05/2013  . Essential hypertension 09/05/2013  . DM type 2, not at goal [facility]) 06/21/2012  . Vitamin D deficiency 06/21/2012    Past Surgical History:  Procedure Laterality Date  . ABDOMINAL HYSTERECTOMY  1982   partial    OB History    No data available        Home Medications    Prior to Admission medications   Medication Sig Start Date End Date Taking? Authorizing Provider  acetaminophen (TYLENOL) 325 MG tablet Take 650 mg by mouth every 6 (six) hours as needed (pain).   Yes Historical Provider, MD  Cyanocobalamin (VITAMIN B-12) 1000 MCG SUBL Place 1 tablet (1,000 mcg total) under the tongue daily. 12/23/14  Yes Aleksei Plotnikov V, MD   losartan (COZAAR) 100 MG tablet Take 1 tablet (100 mg total) by mouth daily. 02/18/16 02/17/17 Yes Aleksei Plotnikov V, MD  metFORMIN (GLUCOPHAGE) 500 MG tablet Take 1 tablet (500 mg total) by mouth 2 (two) times daily with a meal. 12/18/14  Yes Aleksei Plotnikov V, MD  meloxicam (MOBIC) 7.5 MG tablet Take 1-2 tablets (7.5-15 mg total) by mouth daily. Patient not taking: Reported on 06/10/2016 02/18/16   Tresa Garter, MD      Family History  Problem Relation Age of Onset  . Diabetes Sister   . Hypertension Other     Parent  . Heart disease Mother   . Kidney disease Mother      Social History  Substance Use Topics  . Smoking status: Never Smoker  . Smokeless tobacco: Never Used  . Alcohol use No     Allergies     Patient has no known allergies.    Review of Systems  10 Systems reviewed and are negative for acute change except as noted in the HPI.   Physical Exam Updated Vital Signs BP 143/76   Pulse 75   Temp 101.6 F (38.7 C) (Oral)   Resp 13   Ht 5\' 2"  (1.575 m)   Wt 135 lb (61.2 kg)   SpO2 97%   BMI 24.69 kg/m   Physical Exam  Constitutional: She is oriented to person, place, and time. She  appears well-developed and well-nourished. No distress.  HENT:  Head: Normocephalic and atraumatic.  Right Ear: Tympanic membrane and ear canal normal.  Left Ear: Tympanic membrane and ear canal normal.  Moist mucous membranes  Eyes: Conjunctivae are normal. Pupils are equal, round, and reactive to light.  Neck: Normal range of motion. Neck supple.  Cardiovascular: Normal rate, regular rhythm and normal heart sounds.   No murmur heard. Pulmonary/Chest: Effort normal and breath sounds normal.  Abdominal: Soft. Bowel sounds are normal. She exhibits no distension. There is no tenderness.  Musculoskeletal: She exhibits no edema.  Lymphadenopathy:    She has no cervical adenopathy.  Neurological: She is alert and oriented to person, place, and time.  Fluent  speech  Skin: Skin is warm and dry.  Psychiatric: She has a normal mood and affect. Judgment normal.  Nursing note and vitals reviewed.     ED Treatments / Results  Labs (all labs ordered are listed, but only abnormal results are displayed) Labs Reviewed  BASIC METABOLIC PANEL - Abnormal; Notable for the following:       Result Value   Chloride 100 (*)    Glucose, Bld 142 (*)    All other components within normal limits  CBC  INFLUENZA PANEL BY PCR (TYPE A & B)  I-STAT TROPOININ, ED  I-STAT CG4 LACTIC ACID, ED  Rosezena SensorI-STAT TROPOININ, ED     EKG  EKG Interpretation  Date/Time:  Thursday June 10 2016 12:46:41 EST Ventricular Rate:  77 PR Interval:    QRS Duration: 74 QT Interval:  366 QTC Calculation: 415 R Axis:   51 Text Interpretation:  Sinus rhythm Left ventricular hypertrophy since previous tracing, tachycardia has resolved T waves normalized Confirmed by LITTLE MD, RACHEL [phone]) on 06/10/2016 1:30:59 PM         Radiology Dg Chest 2 View  Result Date: 06/10/2016 CLINICAL DATA:  Cough, chest pain. EXAM: CHEST  2 VIEW COMPARISON:  Radiographs of May 09, 2013. FINDINGS: The heart size and mediastinal contours are within normal limits. Both lungs are clear. No pneumothorax or pleural effusion is noted. The visualized skeletal structures are unremarkable. IMPRESSION: No active cardiopulmonary disease. Electronically Signed   By: Lupita RaiderJames  Green Jr, M.D.   On: 06/10/2016 09:43    Procedures Procedures (including critical care time) Procedures  Medications Ordered in ED  Medications  acetaminophen (TYLENOL) tablet 650 mg (650 mg Oral Given 06/10/16 [id number])     Initial Impression / Assessment and Plan / ED Course  I have reviewed the triage vital signs and the nursing notes.  Pertinent labs & imaging results that were available during my care of the patient were reviewed by me and considered in my medical decision making (see chart for details).    Pt w/ a few  days of ILI including cough, body aches, and recently fevers. She was nontoxic and in no acute distress at presentation. She did have fever at triage of 1 and 1.6 for which she was given Tylenol. The rest of her vital signs were normal including normal O2 saturation on room air. Clear breath sounds. Labwork shows normal lactate, CBC and BMP. Chest x-ray negative for acute process. I did obtain serial troponins and EKG given the patient's reported chest pain with coughing. Serial troponins were negative, no ischemia on EKG. She was initially tachycardic but repeat EKG after fever improved and was normal. She denies any chest pain at rest or with exertion and denies any other associated symptoms to suggest  ACS. She also has no risk factors for PE. I have discussed risks and benefits of Tamiflu and patient has decided to decline Tamiflu treatment. Discussed supportive care and reviewed return precautions including chest pain at rest, shortness of breath, or worsening symptoms. Patient voiced understanding and was discharged in satisfactory condition.  Final Clinical Impressions(s) / ED Diagnoses   Final diagnoses:  None     New Prescriptions   No medications on file       Laurence Spates, MD 06/10/16 1351

## 2017-07-05 IMAGING — CT CT HEAD W/O CM
2 series · 16 of 30 positions shown, 19 images · non-contrast
Comparison: CT of the head performed 05/05/2012

CLINICAL DATA: Acute onset of diffuse headache and nausea. Initial
encounter.

EXAM:
CT HEAD WITHOUT CONTRAST
TECHNIQUE: Contiguous axial images were obtained from the base of the skull
through the vertex without intravenous contrast.

[Series 2: head w/o · axial · non-contrast · 0.39mm/px · z∈[-612,-492]mm · 9 of 31 slices shown, 12 images]
[im 4/31  brain]
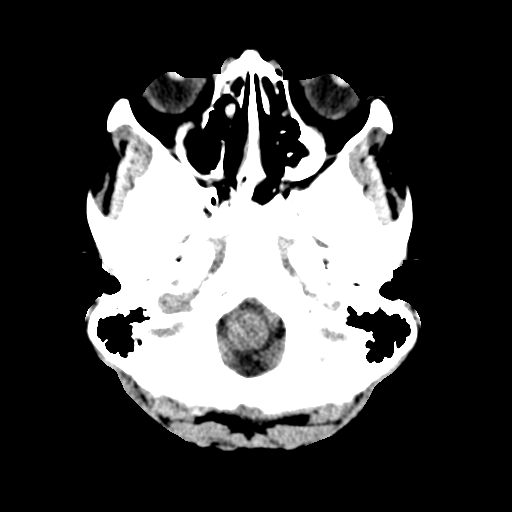
[im 4/31  bone]
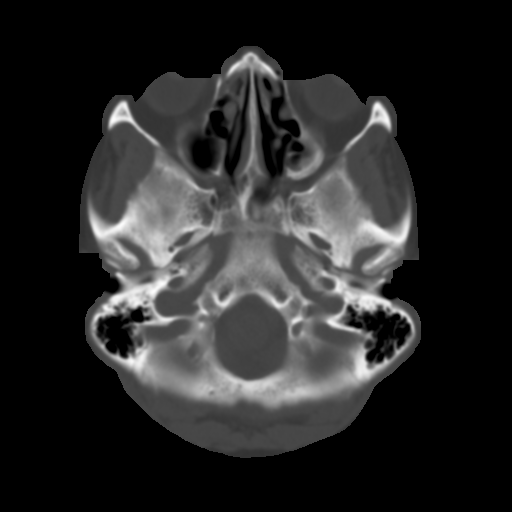
[im 7/31  brain]
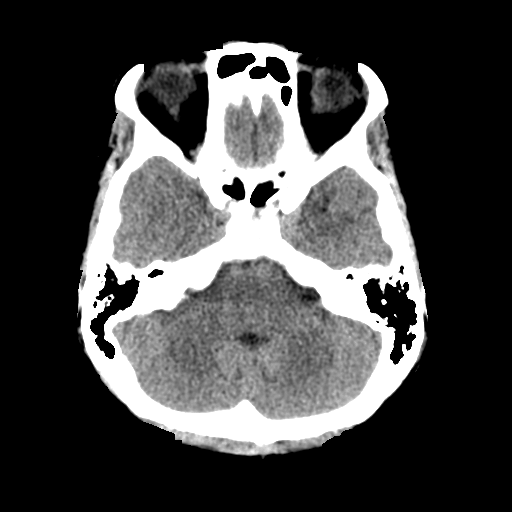
[im 10/31  brain]
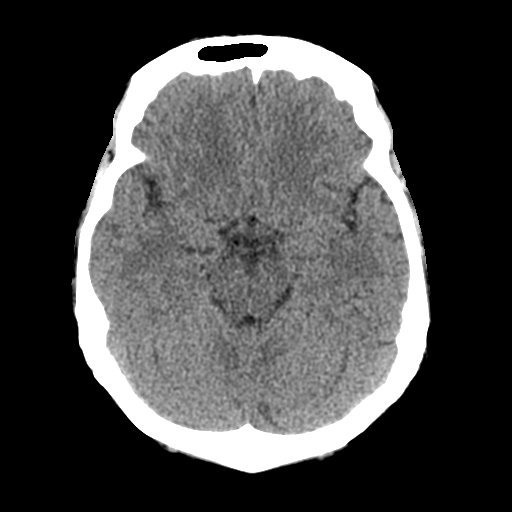
[im 13/31  brain]
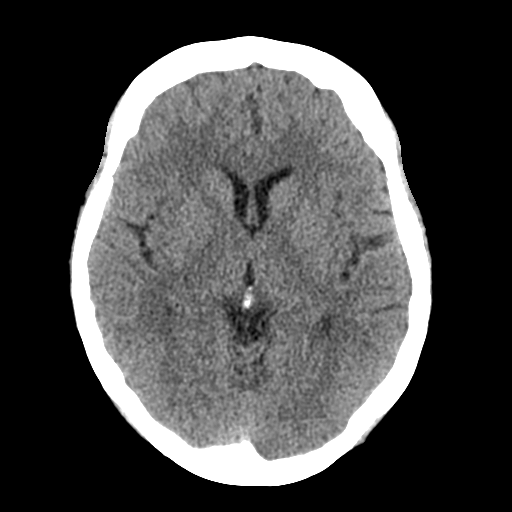
[im 16/31  brain]
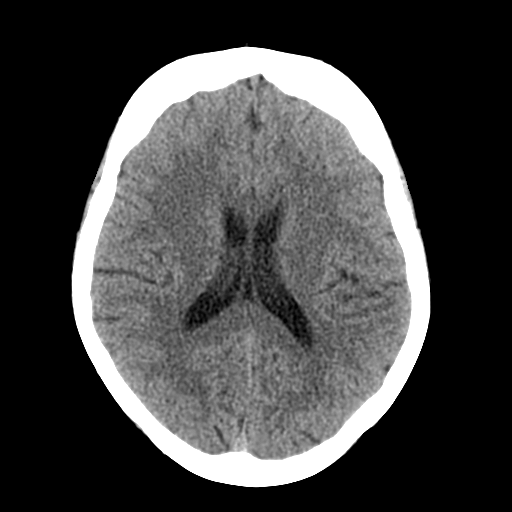
[im 16/31  bone]
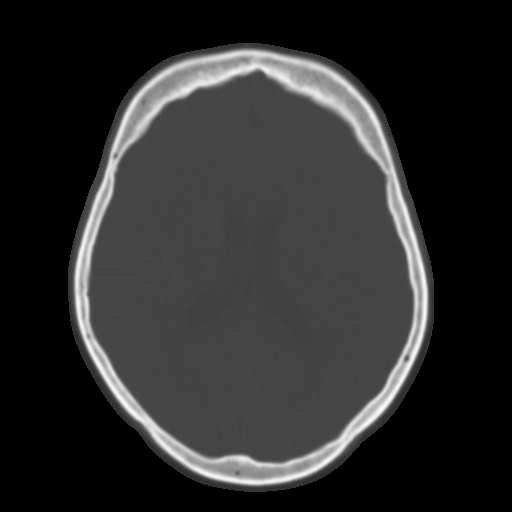
[im 19/31  brain]
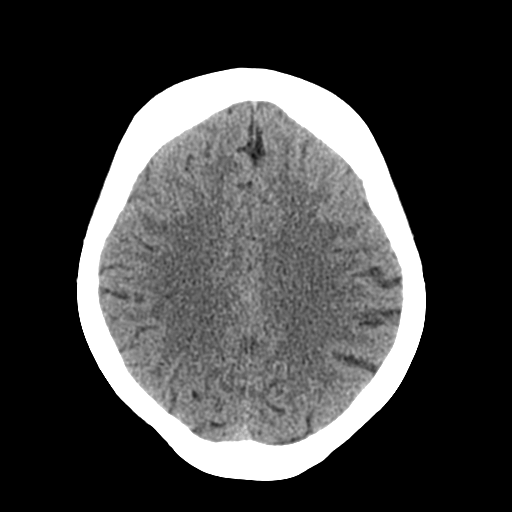
[im 22/31  brain]
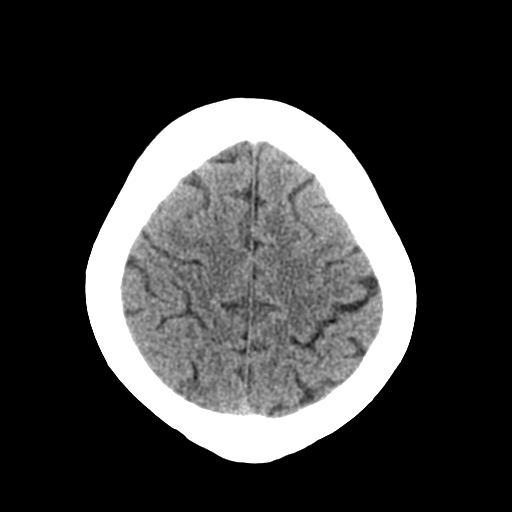
[im 25/31  brain]
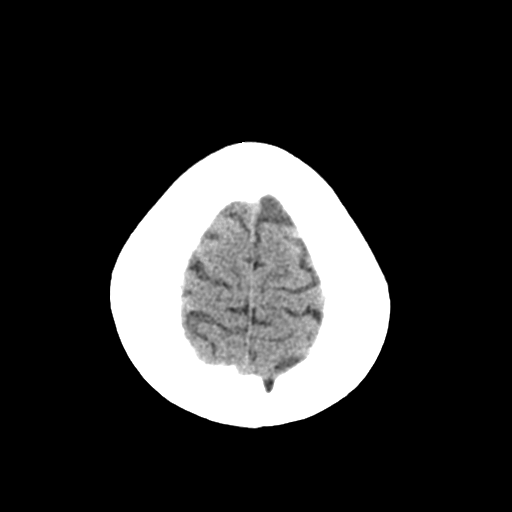
[im 28/31  brain]
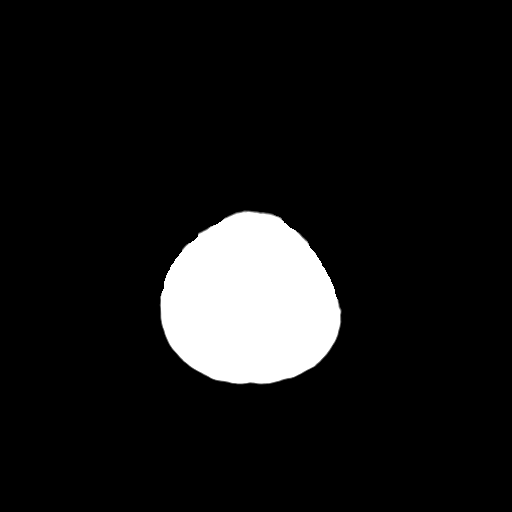
[im 28/31  bone]
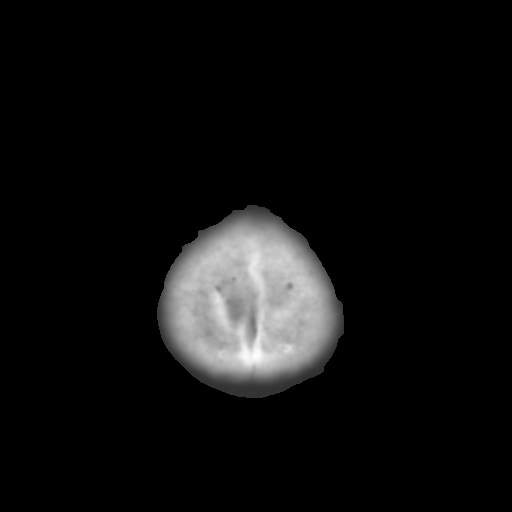

[Series 3: bone windows · axial · 0.39mm/px · z∈[-612,-513]mm · 7 of 51 slices shown]
[im 6/51  bone]
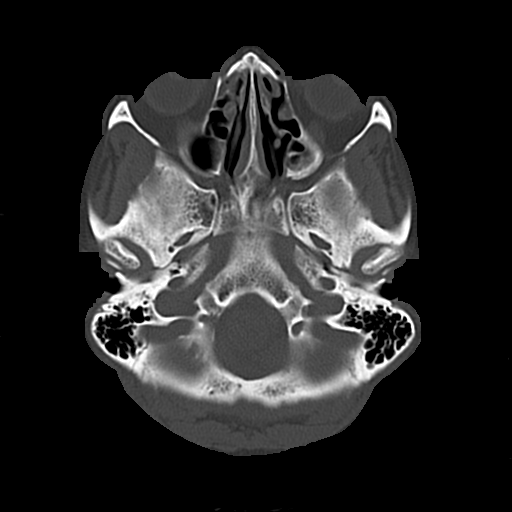
[im 12/51  bone]
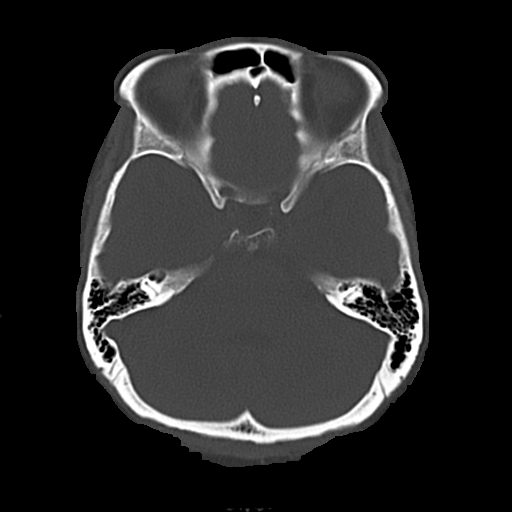
[im 17/51  bone]
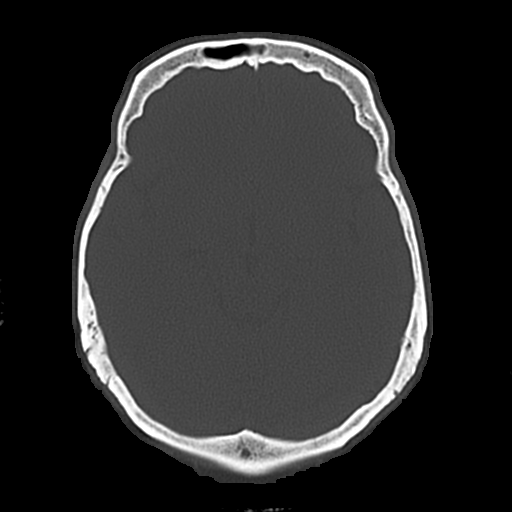
[im 23/51  bone]
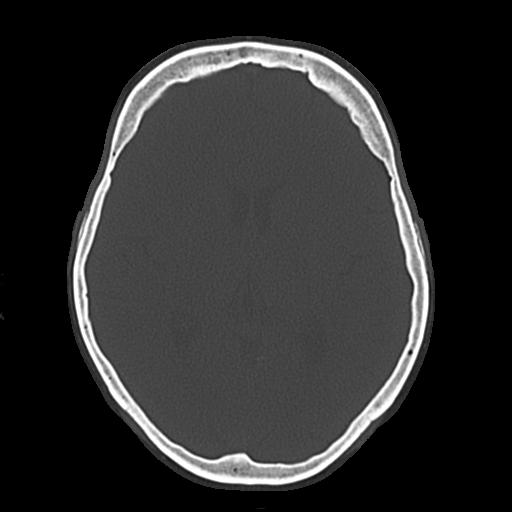
[im 28/51  bone]
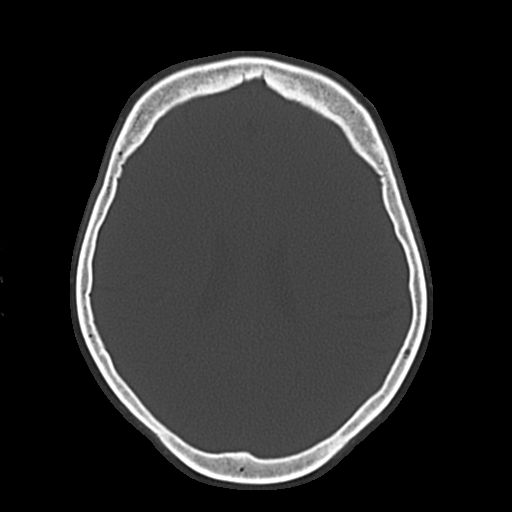
[im 34/51  bone]
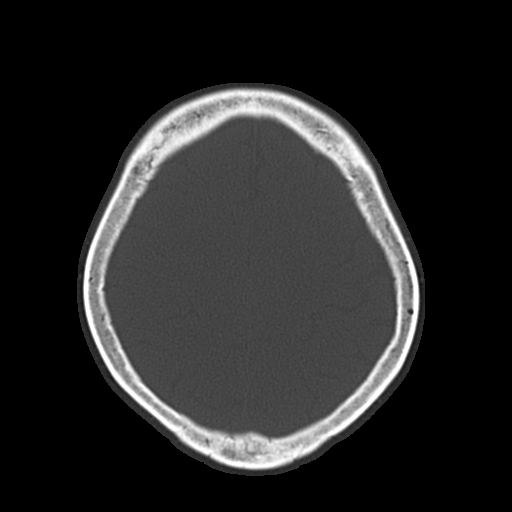
[im 39/51  bone]
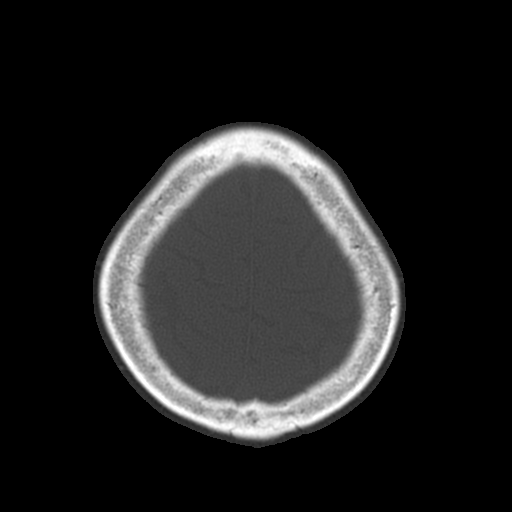

[16 of 30 positions shown; findings below may reference images not displayed]

FINDINGS: There is no evidence of acute infarction, mass lesion, or intra- or
extra-axial hemorrhage on CT.

The posterior fossa, including the cerebellum, brainstem and fourth
ventricle, is within normal limits. The third and lateral
ventricles, and basal ganglia are unremarkable in appearance. The
cerebral hemispheres are symmetric in appearance, with normal
gray-white differentiation. No mass effect or midline shift is seen.

There is no evidence of fracture; visualized osseous structures are
unremarkable in appearance. The visualized portions of the orbits
are within normal limits. There is mild partial opacification of the
left maxillary sinus. The remaining paranasal sinuses and mastoid
air cells are well-aerated. No significant soft tissue abnormalities
are seen.
IMPRESSION: 1. No acute intracranial pathology seen on CT.
2. Mild partial opacification of the left maxillary sinus.

## 2017-09-01 ENCOUNTER — Telehealth: Payer: Self-pay | Admitting: Internal Medicine

## 2017-09-01 NOTE — Telephone Encounter (Signed)
Last Cologurad: 4/ 30/ 2015 LOV: 10/ 11/ 2017 Next appt: Not sch'ed Would you like to reorder cologuard?

## 2017-09-01 NOTE — Telephone Encounter (Signed)
Pt is overdue for an appt.  Can you contact to schedule PCP.

## 2017-09-02 NOTE — Telephone Encounter (Signed)
Left message for patient to call back to schedule an appointment. (please schedule in a 30 minute slot)

## 2018-06-13 ENCOUNTER — Ambulatory Visit (INDEPENDENT_AMBULATORY_CARE_PROVIDER_SITE_OTHER): Payer: Medicare Other | Admitting: Internal Medicine

## 2018-06-13 ENCOUNTER — Other Ambulatory Visit (INDEPENDENT_AMBULATORY_CARE_PROVIDER_SITE_OTHER): Payer: Medicare Other

## 2018-06-13 ENCOUNTER — Encounter: Payer: Self-pay | Admitting: Internal Medicine

## 2018-06-13 VITALS — BP 136/78 | HR 65 | Temp 98.1°F | Ht 62.0 in | Wt 157.0 lb

## 2018-06-13 DIAGNOSIS — M79643 Pain in unspecified hand: Secondary | ICD-10-CM | POA: Insufficient documentation

## 2018-06-13 DIAGNOSIS — E119 Type 2 diabetes mellitus without complications: Secondary | ICD-10-CM

## 2018-06-13 DIAGNOSIS — M79641 Pain in right hand: Secondary | ICD-10-CM

## 2018-06-13 DIAGNOSIS — E559 Vitamin D deficiency, unspecified: Secondary | ICD-10-CM | POA: Diagnosis not present

## 2018-06-13 DIAGNOSIS — E538 Deficiency of other specified B group vitamins: Secondary | ICD-10-CM

## 2018-06-13 DIAGNOSIS — Z0001 Encounter for general adult medical examination with abnormal findings: Secondary | ICD-10-CM

## 2018-06-13 DIAGNOSIS — R202 Paresthesia of skin: Secondary | ICD-10-CM | POA: Diagnosis not present

## 2018-06-13 DIAGNOSIS — Z0000 Encounter for general adult medical examination without abnormal findings: Secondary | ICD-10-CM

## 2018-06-13 LAB — BASIC METABOLIC PANEL
BUN: 12 mg/dL (ref 6–23)
CALCIUM: 10.2 mg/dL (ref 8.4–10.5)
CO2: 31 mEq/L (ref 19–32)
Chloride: 104 mEq/L (ref 96–112)
Creatinine, Ser: 0.84 mg/dL (ref 0.40–1.20)
GFR: 81.53 mL/min (ref >60.00)
Glucose, Bld: 130 mg/dL — ABNORMAL HIGH (ref 70–99)
POTASSIUM: 4.3 meq/L (ref 3.5–5.1)
SODIUM: 141 meq/L (ref 135–145)

## 2018-06-13 LAB — HEPATIC FUNCTION PANEL
ALBUMIN: 4.6 g/dL (ref 3.5–5.2)
ALT: 18 U/L (ref 0–35)
AST: 16 U/L (ref 0–37)
Alkaline Phosphatase: 98 U/L (ref 39–117)
BILIRUBIN TOTAL: 0.5 mg/dL (ref 0.2–1.2)
Bilirubin, Direct: 0.1 mg/dL (ref 0.0–0.3)
TOTAL PROTEIN: 8.1 g/dL (ref 6.0–8.3)

## 2018-06-13 LAB — CBC WITH DIFFERENTIAL/PLATELET
BASOS PCT: 1.3 % (ref 0.0–3.0)
Basophils Absolute: 0.1 10*3/uL (ref 0.0–0.1)
Eosinophils Absolute: 0.3 10*3/uL (ref 0.0–0.7)
Eosinophils Relative: 4.2 % (ref 0.0–5.0)
HCT: 36.8 % (ref 36.0–46.0)
Hemoglobin: 12.4 g/dL (ref 12.0–15.0)
LYMPHS ABS: 3.2 10*3/uL (ref 0.7–4.0)
Lymphocytes Relative: 40.3 % (ref 12.0–46.0)
MCHC: 33.6 g/dL (ref 30.0–36.0)
MCV: 92.4 fl (ref 78.0–100.0)
MONO ABS: 0.6 10*3/uL (ref 0.1–1.0)
MONOS PCT: 7.3 % (ref 3.0–12.0)
NEUTROS PCT: 46.9 % (ref 43.0–77.0)
Neutro Abs: 3.7 10*3/uL (ref 1.4–7.7)
Platelets: 257 10*3/uL (ref 150.0–400.0)
RBC: 3.98 Mil/uL (ref 3.87–5.11)
RDW: 13.4 % (ref 11.5–15.5)
WBC: 7.9 10*3/uL (ref 4.0–10.5)

## 2018-06-13 LAB — LIPID PANEL
CHOLESTEROL: 162 mg/dL (ref 0–200)
HDL: 44.7 mg/dL (ref >39.00)
LDL Cholesterol: 89 mg/dL (ref 0–99)
NonHDL: 117.01
TRIGLYCERIDES: 138 mg/dL (ref 0.0–149.0)
Total CHOL/HDL Ratio: 4
VLDL: 27.6 mg/dL (ref 0.0–40.0)

## 2018-06-13 LAB — MICROALBUMIN / CREATININE URINE RATIO
CREATININE, U: 122.5 mg/dL
MICROALB/CREAT RATIO: 1.4 mg/g (ref 0.0–30.0)
Microalb, Ur: 1.7 mg/dL (ref 0.0–1.9)

## 2018-06-13 LAB — TSH: TSH: 2.59 u[IU]/mL (ref 0.35–4.50)

## 2018-06-13 MED ORDER — MELOXICAM 15 MG PO TABS: 15.0000 mg | ORAL_TABLET | Freq: Every day | ORAL | 0 refills | Status: DC

## 2018-06-13 NOTE — Patient Instructions (Addendum)
Cardiac CT calcium scoring test $150   Computed tomography, more commonly known as a CT or CAT scan, is a diagnostic medical imaging test. Like traditional x-rays, it produces multiple images or pictures of the inside of the body. The cross-sectional images generated during a CT scan can be reformatted in multiple planes. They can even generate three-dimensional images. These images can be viewed on a computer monitor, printed on film or by a 3D printer, or transferred to a CD or DVD. CT images of internal organs, bones, soft tissue and blood vessels provide greater detail than traditional x-rays, particularly of soft tissues and blood vessels. A cardiac CT scan for coronary calcium is a non-invasive way of obtaining information about the presence, location and extent of calcified plaque in the coronary arteries-the vessels that supply oxygen-containing blood to the heart muscle. Calcified plaque results when there is a build-up of fat and other substances under the inner layer of the artery. This material can calcify which signals the presence of atherosclerosis, a disease of the vessel wall, also called coronary artery disease (CAD). People with this disease have an increased risk for heart attacks. In addition, over time, progression of plaque build up (CAD) can narrow the arteries or even close off blood flow to the heart. The result may be chest pain, sometimes called "angina," or a heart attack. Because calcium is a marker of CAD, the amount of calcium detected on a cardiac CT scan is a helpful prognostic tool. The findings on cardiac CT are expressed as a calcium score. Another name for this test is coronary artery calcium scoring.  What are some common uses of the procedure? The goal of cardiac CT scan for calcium scoring is to determine if CAD is present and to what extent, even if there are no symptoms. It is a screening study that may be recommended by a physician for patients with risk factors  for CAD but no clinical symptoms. The major risk factors for CAD are: . high blood cholesterol levels  . family history of heart attacks  . diabetes  . high blood pressure  . cigarette smoking  . overweight or obese  . physical inactivity   A negative cardiac CT scan for calcium scoring shows no calcification within the coronary arteries. This suggests that CAD is absent or so minimal it cannot be seen by this technique. The chance of having a heart attack over the next two to five years is very low under these circumstances. A positive test means that CAD is present, regardless of whether or not the patient is experiencing any symptoms. The amount of calcification-expressed as the calcium score-may help to predict the likelihood of a myocardial infarction (heart attack) in the coming years and helps your medical doctor or cardiologist decide whether the patient may need to take preventive medicine or undertake other measures such as diet and exercise to lower the risk for heart attack. The extent of CAD is graded according to your calcium score:  Calcium Score  Presence of CAD  0 No evidence of CAD   1-10 Minimal evidence of CAD  11-100 Mild evidence of CAD  101-400 Moderate evidence of CAD  Over 400 Extensive evidence of CAD    Ganglion Cyst  A ganglion cyst is a non-cancerous, fluid-filled lump that occurs near a joint or tendon. The cyst grows out of a joint or the lining of a tendon. Ganglion cysts most often develop in the hand or wrist, but they can  also develop in the shoulder, elbow, hip, knee, ankle, or foot. Ganglion cysts are ball-shaped or egg-shaped. Their size can range from the size of a pea to larger than a grape. Increased activity may cause the cyst to get bigger because more fluid starts to build up. What are the causes? The exact cause of this condition is not known, but it may be related to:  Inflammation or irritation around the joint.  An injury.  Repetitive  movements or overuse.  Arthritis. What increases the risk? You are more likely to develop this condition if:  You are a woman.  You are 44-32 years old. What are the signs or symptoms? The main symptom of this condition is a lump. It most often appears on the hand or wrist. In many cases, there are no other symptoms, but a cyst can sometimes cause:  Tingling.  Pain.  Numbness.  Muscle weakness.  Weak grip.  Less range of motion in a joint. How is this diagnosed? Ganglion cysts are usually diagnosed based on a physical exam. Your health care provider will feel the lump and may shine a light next to it. If it is a ganglion cyst, the light will likely shine through it. Your health care provider may order an X-ray, ultrasound, or MRI to rule out other conditions. How is this treated? Ganglion cysts often go away on their own without treatment. If you have pain or other symptoms, treatment may be needed. Treatment is also needed if the ganglion cyst limits your movement or if it gets infected. Treatment may include:  Wearing a brace or splint on your wrist or finger.  Taking anti-inflammatory medicine.  Having fluid drained from the lump with a needle (aspiration).  Getting a steroid injected into the joint.  Having surgery to remove the ganglion cyst.  Placing a pad on your shoe or wearing shoes that will not rub against the cyst if it is on your foot. Follow these instructions at home:  Do not press on the ganglion cyst, poke it with a needle, or hit it.  Take over-the-counter and prescription medicines only as told by your health care provider.  If you have a brace or splint: ? Wear it as told by your health care provider. ? Remove it as told by your health care provider. Ask if you need to remove it when you take a shower or a bath.  Watch your ganglion cyst for any changes.  Keep all follow-up visits as told by your health care provider. This is important. Contact  a health care provider if:  Your ganglion cyst becomes larger or more painful.  You have pus coming from the lump.  You have weakness or numbness in the affected area.  You have a fever or chills. Get help right away if:  You have a fever and have any of these in the cyst area: ? Increased redness. ? Red streaks. ? Swelling. Summary  A ganglion cyst is a non-cancerous, fluid-filled lump that occurs near a joint or tendon.  Ganglion cysts most often develop in the hand or wrist, but they can also develop in the shoulder, elbow, hip, knee, ankle, or foot.  Ganglion cysts often go away on their own without treatment. This information is not intended to replace advice given to you by your health care provider. Make sure you discuss any questions you have with your health care provider. Document Released: 04/23/2000 Document Revised: 12/24/2016 Document Reviewed: 12/24/2016 Elsevier Interactive Patient Education  2019 [place].

## 2018-06-13 NOTE — Progress Notes (Signed)
Subjective:  Patient ID: Sandra Petersen, female    DOB: 07-12-1950  Age: 68 y.o. MRN: [id number]  CC: No chief complaint on file.   HPI Sandra Petersen presents for a well exam C/o R hand pain x 1.5 weeks, palm  Outpatient Medications Prior to Visit  Medication Sig Dispense Refill  . acetaminophen (TYLENOL) 325 MG tablet Take 650 mg by mouth every 6 (six) hours as needed (pain).    . Cyanocobalamin (VITAMIN B-12) 1000 MCG SUBL Place 1 tablet (1,000 mcg total) under the tongue daily. 100 tablet 3  . meloxicam (MOBIC) 7.5 MG tablet Take 1-2 tablets (7.5-15 mg total) by mouth daily. 30 tablet 1  . metFORMIN (GLUCOPHAGE) 500 MG tablet Take 1 tablet (500 mg total) by mouth 2 (two) times daily with a meal. 60 tablet 11  . losartan (COZAAR) 100 MG tablet Take 1 tablet (100 mg total) by mouth daily. 30 tablet 11   No facility-administered medications prior to visit.     ROS: Review of Systems  Constitutional: Negative for activity change, appetite change, chills, fatigue and unexpected weight change.  HENT: Negative for congestion, mouth sores and sinus pressure.   Eyes: Negative for visual disturbance.  Respiratory: Negative for cough and chest tightness.   Gastrointestinal: Negative for abdominal pain and nausea.  Genitourinary: Negative for difficulty urinating, frequency and vaginal pain.  Musculoskeletal: Positive for arthralgias. Negative for back pain and gait problem.  Skin: Negative for pallor and rash.  Neurological: Negative for dizziness, tremors, weakness, numbness and headaches.  Psychiatric/Behavioral: Negative for confusion and sleep disturbance.    Objective:  BP 136/78 (BP Location: Left Arm, Patient Position: Sitting, Cuff Size: Normal)   Pulse 65   Temp 98.1 F (36.7 C) (Oral)   Ht 5\' 2"  (1.575 m)   Wt 157 lb (71.2 kg)   SpO2 99%   BMI 28.72 kg/m   BP Readings from Last 3 Encounters:  06/13/18 136/78  06/10/16 143/76  02/18/16 (!) 150/80    Wt  Readings from Last 3 Encounters:  06/13/18 157 lb (71.2 kg)  06/10/16 135 lb (61.2 kg)  02/18/16 142 lb (64.4 kg)    Physical Exam Constitutional:      General: She is not in acute distress.    Appearance: She is well-developed.  HENT:     Head: Normocephalic.     Right Ear: External ear normal.     Left Ear: External ear normal.     Nose: Nose normal.  Eyes:     General:        Right eye: No discharge.        Left eye: No discharge.     Conjunctiva/sclera: Conjunctivae normal.     Pupils: Pupils are equal, round, and reactive to light.  Neck:     Musculoskeletal: Normal range of motion and neck supple.     Thyroid: No thyromegaly.     Vascular: No JVD.     Trachea: No tracheal deviation.  Cardiovascular:     Rate and Rhythm: Normal rate and regular rhythm.     Heart sounds: Normal heart sounds.  Pulmonary:     Effort: No respiratory distress.     Breath sounds: No stridor. No wheezing.  Abdominal:     General: Bowel sounds are normal. There is no distension.     Palpations: Abdomen is soft. There is no mass.     Tenderness: There is no abdominal tenderness. There is no guarding or rebound.  Musculoskeletal:        General: Tenderness present.  Lymphadenopathy:     Cervical: No cervical adenopathy.  Skin:    Findings: No erythema or rash.  Neurological:     Cranial Nerves: No cranial nerve deficit.     Motor: No abnormal muscle tone.     Coordination: Coordination normal.     Deep Tendon Reflexes: Reflexes normal.  Psychiatric:        Behavior: Behavior normal.        Thought Content: Thought content normal.        Judgment: Judgment normal.    R palm is tender over 3d finger - ?ganglion   Lab Results  Component Value Date   WBC 8.6 06/10/2016   HGB 12.3 06/10/2016   HCT 38.5 06/10/2016   PLT 227 06/10/2016   GLUCOSE 142 (H) 06/10/2016   CHOL 145 12/23/2014   TRIG 58.0 12/23/2014   HDL 46.00 12/23/2014   LDLCALC 87 12/23/2014   ALT 22 05/26/2015    AST 23 05/26/2015   NA 136 06/10/2016   K 4.2 06/10/2016   CL 100 (L) 06/10/2016   CREATININE 0.69 06/10/2016   BUN 11 06/10/2016   CO2 26 06/10/2016   TSH 4.07 12/23/2014   HGBA1C 6.7 (H) 10/15/2015   MICROALBUR 1.1 12/23/2014    Dg Chest 2 View  Result Date: 06/10/2016 CLINICAL DATA:  Cough, chest pain. EXAM: CHEST  2 VIEW COMPARISON:  Radiographs of May 09, 2013. FINDINGS: The heart size and mediastinal contours are within normal limits. Both lungs are clear. No pneumothorax or pleural effusion is noted. The visualized skeletal structures are unremarkable. IMPRESSION: No active cardiopulmonary disease. Electronically Signed   By: Lupita RaiderJames  Green Jr, M.D.   On: 06/10/2016 09:43    Assessment & Plan:   There are no diagnoses linked to this encounter.   No orders of the defined types were placed in this encounter.    Follow-up: No follow-ups on file.  Sonda PrimesAlex Plotnikov, MD

## 2018-06-13 NOTE — Assessment & Plan Note (Signed)
Labs

## 2018-06-13 NOTE — Assessment & Plan Note (Signed)
cardiac CT scan for calcium scoring offered Metformin

## 2018-06-13 NOTE — Assessment & Plan Note (Addendum)
Here for medicare wellness/physical  Diet: heart healthy  Physical activity: not sedentary  Depression/mood screen: negative  Hearing: intact to whispered voice  Visual acuity: grossly normal - reading glasses, performs annual eye exam  ADLs: capable  Fall risk: low to none  Home safety: good  Cognitive evaluation: intact to orientation, naming, recall and repetition  EOL planning: adv directives, full code/ I agree  I have personally reviewed and have noted  1. The patient's medical, surgical and social history  2. Their use of alcohol, tobacco or illicit drugs  3. Their current medications and supplements  4. The patient's functional ability including ADL's, fall risks, home safety risks and hearing or visual impairment.  5. Diet and physical activities  6. Evidence for depression or mood disorders 7. The roster of all physicians providing medical care to patient - is listed in the Snapshot section of the chart and reviewed today.    Today patient counseled on age appropriate routine health concerns for screening and prevention, each reviewed and up to date or declined. Immunizations reviewed and up to date or declined. Labs ordered and reviewed. Risk factors for depression reviewed and negative. Hearing function and visual acuity are intact. ADLs screened and addressed as needed. Functional ability and level of safety reviewed and appropriate. Education, counseling and referrals performed based on assessed risks today. Patient provided with a copy of personalized plan for preventive services.  cardiac CT scan for calcium scoring offered 2/20  S/p partial hysterect in 1982  Refused shots, cologuard (-) 2015 Ophth, mammo q 12-24 mo

## 2018-06-13 NOTE — Assessment & Plan Note (Signed)
R palm is tender over 3d finger - ?ganglion Declined injection

## 2018-06-14 ENCOUNTER — Other Ambulatory Visit: Payer: Self-pay | Admitting: Internal Medicine

## 2018-06-14 LAB — URINALYSIS
Bilirubin Urine: NEGATIVE
Hgb urine dipstick: NEGATIVE
KETONES UR: NEGATIVE
LEUKOCYTES UA: NEGATIVE
Nitrite: NEGATIVE
PH: 8 (ref 5.0–8.0)
Specific Gravity, Urine: 1.02 (ref 1.000–1.030)
TOTAL PROTEIN, URINE-UPE24: NEGATIVE
URINE GLUCOSE: NEGATIVE
Urobilinogen, UA: 1 (ref 0.0–1.0)

## 2018-06-14 LAB — VITAMIN D 25 HYDROXY (VIT D DEFICIENCY, FRACTURES): VITD: 17.49 ng/mL — ABNORMAL LOW (ref 30.00–100.00)

## 2018-06-14 LAB — VITAMIN B12: Vitamin B-12: 273 pg/mL (ref 211–911)

## 2018-06-14 LAB — HEMOGLOBIN A1C: HEMOGLOBIN A1C: 8.4 % — AB (ref 4.6–6.5)

## 2018-06-14 MED ORDER — VITAMIN D3 1.25 MG (50000 UT) PO CAPS: 1.0000 | ORAL_CAPSULE | ORAL | 0 refills | Status: DC

## 2018-06-14 MED ORDER — B COMPLEX PO TABS: 1.0000 | ORAL_TABLET | Freq: Every day | ORAL | 3 refills | Status: AC

## 2018-06-14 MED ORDER — VITAMIN D3 50 MCG (2000 UT) PO CAPS: 2000.0000 [IU] | ORAL_CAPSULE | Freq: Every day | ORAL | 3 refills | Status: AC

## 2018-10-27 ENCOUNTER — Encounter: Payer: Self-pay | Admitting: Internal Medicine

## 2018-10-27 ENCOUNTER — Other Ambulatory Visit: Payer: Self-pay

## 2018-10-27 ENCOUNTER — Ambulatory Visit (INDEPENDENT_AMBULATORY_CARE_PROVIDER_SITE_OTHER): Payer: Medicare Other | Admitting: Internal Medicine

## 2018-10-27 DIAGNOSIS — B349 Viral infection, unspecified: Secondary | ICD-10-CM | POA: Diagnosis not present

## 2018-10-27 MED ORDER — METFORMIN HCL 500 MG PO TABS: 500.0000 mg | ORAL_TABLET | Freq: Two times a day (BID) | ORAL | 0 refills | Status: DC

## 2018-10-27 NOTE — Progress Notes (Addendum)
Virtual Visit via Telephone Note  I connected with Sandra Petersen on 10/27/18 at 12:00 PM EDT by telephone and verified that I am speaking with the correct person using two identifiers.   I discussed the limitations of evaluation and management by telemedicine and the availability of in person appointments. The patient expressed understanding and agreed to proceed.  The patient is currently at home and I am in the office.    No referring provider.    History of Present Illness: This is an acute visit for cold symptoms.   Her symptoms started last night.  She states a dry cough, fever and headaches.  She sometimes has body aches in the lower part of her body.  He has also noticed decreased taste-nothing tastes good and she has not been eating much.  She denies shortness of breath, wheezing nasal congestion.  She states she is retired and has been home.  She does have some contact with family members and goes to the store, but generally wears a mask.  She is tried taking Mucinex.  Her daughter did arrange for COVID-19 testing for tomorrow at her church.    Review of Systems  Constitutional: Positive for fever.       Decreased taste - not eating much  HENT: Negative for congestion, ear pain, sinus pain and sore throat.   Respiratory: Positive for cough (dry). Negative for shortness of breath and wheezing.   Gastrointestinal: Negative for diarrhea and nausea.  Musculoskeletal: Positive for myalgias (sometimes).  Neurological: Positive for headaches. Negative for dizziness.      Social History   Socioeconomic History  . Marital status: Legally Separated    Spouse name: Not on file  . Number of children: Not on file  . Years of education: 9512  . Highest education level: Not on file  Occupational History  . Occupation: [employer]  Social Needs  . Financial resource strain: Not on file  . Food insecurity    Worry: Not on file    Inability: Not on file  . Transportation needs   Medical: Not on file    Non-medical: Not on file  Tobacco Use  . Smoking status: Never Smoker  . Smokeless tobacco: Never Used  Substance and Sexual Activity  . Alcohol use: No  . Drug use: No  . Sexual activity: Not Currently    Birth control/protection: Post-menopausal  Lifestyle  . Physical activity    Days per week: Not on file    Minutes per session: Not on file  . Stress: Not on file  Relationships  . Social Musicianconnections    Talks on phone: Not on file    Gets together: Not on file    Attends religious service: Not on file    Active member of club or organization: Not on file    Attends meetings of clubs or organizations: Not on file    Relationship status: Not on file  Other Topics Concern  . Not on file  Social History Narrative   Regular exercise-yes   Caffeine Use-yes       Assessment and Plan:  See Problem List for Assessment and Plan of chronic medical problems.   Follow Up Instructions:    I discussed the assessment and treatment plan with the patient. The patient was provided an opportunity to ask questions and all were answered. The patient agreed with the plan and demonstrated an understanding of the instructions.   The patient was advised to call back or seek  an in-person evaluation if the symptoms worsen or if the condition fails to improve as anticipated.   She did ask for refill of her Metformin because she has run out.-Given 1 month prescription.  Advised she should contact her PCP when she is due for a follow-up.  Telephone visit was 6 minutes  Binnie Rail, MD

## 2018-10-27 NOTE — Assessment & Plan Note (Signed)
Her symptoms are consistent with a viral illness. Discussed that she possibly could have COVID-19 and that she should get tested Her daughter has already arrange testing for her to have it done tomorrow so I will not arrange further testing If she does not have COVID-19 she could have a different viral illness and no treatment is needed-only symptomatic treatment Discussed that since her symptoms just started last night her symptoms may change and develop into something else and she should call with any questions or concerns Advised to quarantine herself until she knows the results of her test from tomorrow  Call with any questions or concerns

## 2019-06-18 ENCOUNTER — Ambulatory Visit: Payer: Medicare Other | Attending: Internal Medicine

## 2019-06-18 DIAGNOSIS — Z23 Encounter for immunization: Secondary | ICD-10-CM | POA: Insufficient documentation

## 2019-06-18 NOTE — Progress Notes (Signed)
   Covid-19 Vaccination Clinic  Name:  Sandra Petersen    MRN: [id number] DOB: 04-28-1951  06/18/2019  Sandra Petersen was observed post Covid-19 immunization for 15 minutes without incidence. She was provided with Vaccine Information Sheet and instruction to access the V-Safe system.   Sandra Petersen was instructed to call 911 with any severe reactions post vaccine: Marland Kitchen Difficulty breathing  . Swelling of your face and throat  . A fast heartbeat  . A bad rash all over your body  . Dizziness and weakness    Immunizations Administered    Name Date Dose VIS Date Route   Pfizer COVID-19 Vaccine 06/18/2019  1:42 PM 0.3 mL 04/20/2019 Intramuscular   Manufacturer: [employer], [employer]   Lot: [id number]   NDC: [address]-0

## 2019-07-13 ENCOUNTER — Ambulatory Visit: Payer: Medicare Other | Attending: Internal Medicine

## 2019-07-13 DIAGNOSIS — Z23 Encounter for immunization: Secondary | ICD-10-CM | POA: Insufficient documentation

## 2019-07-13 NOTE — Progress Notes (Signed)
   Covid-19 Vaccination Clinic  Name:  MAURIANA DANN    MRN: [id number] DOB: [birth year]  07/13/2019  Ms. Joss was observed post Covid-19 immunization for 15 minutes without incident. She was provided with Vaccine Information Sheet and instruction to access the V-Safe system.   Ms. Stehr was instructed to call 911 with any severe reactions post vaccine: Marland Kitchen Difficulty breathing  . Swelling of face and throat  . A fast heartbeat  . A bad rash all over body  . Dizziness and weakness   Immunizations Administered    Name Date Dose VIS Date Route   Pfizer COVID-19 Vaccine 07/13/2019  8:18 AM 0.3 mL 04/20/2019 Intramuscular   Manufacturer: [employer], [employer]   Lot: [id number]   NDC: [address]-3

## 2019-09-05 DIAGNOSIS — E119 Type 2 diabetes mellitus without complications: Secondary | ICD-10-CM | POA: Diagnosis not present

## 2019-09-05 DIAGNOSIS — H259 Unspecified age-related cataract: Secondary | ICD-10-CM | POA: Diagnosis not present

## 2019-09-05 DIAGNOSIS — Z135 Encounter for screening for eye and ear disorders: Secondary | ICD-10-CM | POA: Diagnosis not present

## 2019-09-05 DIAGNOSIS — H5203 Hypermetropia, bilateral: Secondary | ICD-10-CM | POA: Diagnosis not present

## 2019-09-05 DIAGNOSIS — H524 Presbyopia: Secondary | ICD-10-CM | POA: Diagnosis not present

## 2019-09-17 DIAGNOSIS — H25811 Combined forms of age-related cataract, right eye: Secondary | ICD-10-CM | POA: Diagnosis not present

## 2019-09-17 DIAGNOSIS — Z01818 Encounter for other preprocedural examination: Secondary | ICD-10-CM | POA: Diagnosis not present

## 2019-09-17 DIAGNOSIS — H2511 Age-related nuclear cataract, right eye: Secondary | ICD-10-CM | POA: Diagnosis not present

## 2019-09-17 DIAGNOSIS — E119 Type 2 diabetes mellitus without complications: Secondary | ICD-10-CM | POA: Diagnosis not present

## 2019-12-04 ENCOUNTER — Ambulatory Visit (INDEPENDENT_AMBULATORY_CARE_PROVIDER_SITE_OTHER): Payer: Medicare HMO

## 2019-12-04 DIAGNOSIS — Z0000 Encounter for general adult medical examination without abnormal findings: Secondary | ICD-10-CM | POA: Diagnosis not present

## 2019-12-04 NOTE — Patient Instructions (Addendum)
[name] , Thank you for taking time to come for your Medicare Wellness Visit. I appreciate your ongoing commitment to your health goals. Please review the following plan we discussed and let me know if I can assist you in the future.   Screening recommendations/referrals: Colonoscopy: Cologuard; due every 3 years Mammogram: refused Bone Density: 08/15/2013; due every 2-3 years Recommended yearly ophthalmology/optometry visit for glaucoma screening and checkup Recommended yearly dental visit for hygiene and checkup  Vaccinations: Influenza vaccine: declined Pneumococcal vaccine: declined Tdap vaccine: declined Shingles vaccine: declined   Covid-19: completed  Advanced directives: Advance directive discussed with you today. Even though you declined this today please call our office should you change your mind and we can give you the proper paperwork for you to fill out.   Conditions/risks identified: Please continue to do your personal lifestyle choices by: daily care of teeth and gums, regular physical activity (goal should be 5 days a week for 30 minutes), eat a healthy diet, avoid tobacco and drug use, limiting any alcohol intake, taking a low-dose aspirin (if not allergic or have been advised by your provider otherwise) and taking vitamins and minerals as recommended by your provider. Continue doing brain stimulating activities (puzzles, reading, adult coloring books, staying active) to keep memory sharp. Continue to eat heart healthy diet (full of fruits, vegetables, whole grains, lean protein, water--limit salt, fat, and sugar intake) and increase physical activity as tolerated.  Next appointment: Please schedule your next Medicare Wellness Visit with your Nurse Health Advisor in 1 year.  Preventive Care 29 Years and Older, Female Preventive care refers to lifestyle choices and visits with your health care provider that can promote health and wellness. What does preventive care  include?  A yearly physical exam. This is also called an annual well check.  Dental exams once or twice a year.  Routine eye exams. Ask your health care provider how often you should have your eyes checked.  Personal lifestyle choices, including:  Daily care of your teeth and gums.  Regular physical activity.  Eating a healthy diet.  Avoiding tobacco and drug use.  Limiting alcohol use.  Practicing safe sex.  Taking low-dose aspirin every day.  Taking vitamin and mineral supplements as recommended by your health care provider. What happens during an annual well check? The services and screenings done by your health care provider during your annual well check will depend on your age, overall health, lifestyle risk factors, and family history of disease. Counseling  Your health care provider may ask you questions about your:  Alcohol use.  Tobacco use.  Drug use.  Emotional well-being.  Home and relationship well-being.  Sexual activity.  Eating habits.  History of falls.  Memory and ability to understand (cognition).  Work and work Astronomer.  Reproductive health. Screening  You may have the following tests or measurements:  Height, weight, and BMI.  Blood pressure.  Lipid and cholesterol levels. These may be checked every 5 years, or more frequently if you are over 37 years old.  Skin check.  Lung cancer screening. You may have this screening every year starting at age 60 if you have a 30-pack-year history of smoking and currently smoke or have quit within the past 15 years.  Fecal occult blood test (FOBT) of the stool. You may have this test every year starting at age 26.  Flexible sigmoidoscopy or colonoscopy. You may have a sigmoidoscopy every 5 years or a colonoscopy every 10 years starting at age  50.  Hepatitis C blood test.  Hepatitis B blood test.  Sexually transmitted disease (STD) testing.  Diabetes screening. This is done by  checking your blood sugar (glucose) after you have not eaten for a while (fasting). You may have this done every 1-3 years.  Bone density scan. This is done to screen for osteoporosis. You may have this done starting at age 21.  Mammogram. This may be done every 1-2 years. Talk to your health care provider about how often you should have regular mammograms. Talk with your health care provider about your test results, treatment options, and if necessary, the need for more tests. Vaccines  Your health care provider may recommend certain vaccines, such as:  Influenza vaccine. This is recommended every year.  Tetanus, diphtheria, and acellular pertussis (Tdap, Td) vaccine. You may need a Td booster every 10 years.  Zoster vaccine. You may need this after age 21.  Pneumococcal 13-valent conjugate (PCV13) vaccine. One dose is recommended after age 60.  Pneumococcal polysaccharide (PPSV23) vaccine. One dose is recommended after age 102. Talk to your health care provider about which screenings and vaccines you need and how often you need them. This information is not intended to replace advice given to you by your health care provider. Make sure you discuss any questions you have with your health care provider. Document Released: 05/23/2015 Document Revised: 01/14/2016 Document Reviewed: 02/25/2015 Elsevier Interactive Patient Education  2017 [place] Prevention in the Home Falls can cause injuries. They can happen to people of all ages. There are many things you can do to make your home safe and to help prevent falls. What can I do on the outside of my home?  Regularly fix the edges of walkways and driveways and fix any cracks.  Remove anything that might make you trip as you walk through a door, such as a raised step or threshold.  Trim any bushes or trees on the path to your home.  Use bright outdoor lighting.  Clear any walking paths of anything that might make someone trip,  such as rocks or tools.  Regularly check to see if handrails are loose or broken. Make sure that both sides of any steps have handrails.  Any raised decks and porches should have guardrails on the edges.  Have any leaves, snow, or ice cleared regularly.  Use sand or salt on walking paths during winter.  Clean up any spills in your garage right away. This includes oil or grease spills. What can I do in the bathroom?  Use night lights.  Install grab bars by the toilet and in the tub and shower. Do not use towel bars as grab bars.  Use non-skid mats or decals in the tub or shower.  If you need to sit down in the shower, use a plastic, non-slip stool.  Keep the floor dry. Clean up any water that spills on the floor as soon as it happens.  Remove soap buildup in the tub or shower regularly.  Attach bath mats securely with double-sided non-slip rug tape.  Do not have throw rugs and other things on the floor that can make you trip. What can I do in the bedroom?  Use night lights.  Make sure that you have a light by your bed that is easy to reach.  Do not use any sheets or blankets that are too big for your bed. They should not hang down onto the floor.  Have a firm chair that  has side arms. You can use this for support while you get dressed.  Do not have throw rugs and other things on the floor that can make you trip. What can I do in the kitchen?  Clean up any spills right away.  Avoid walking on wet floors.  Keep items that you use a lot in easy-to-reach places.  If you need to reach something above you, use a strong step stool that has a grab bar.  Keep electrical cords out of the way.  Do not use floor polish or wax that makes floors slippery. If you must use wax, use non-skid floor wax.  Do not have throw rugs and other things on the floor that can make you trip. What can I do with my stairs?  Do not leave any items on the stairs.  Make sure that there are  handrails on both sides of the stairs and use them. Fix handrails that are broken or loose. Make sure that handrails are as long as the stairways.  Check any carpeting to make sure that it is firmly attached to the stairs. Fix any carpet that is loose or worn.  Avoid having throw rugs at the top or bottom of the stairs. If you do have throw rugs, attach them to the floor with carpet tape.  Make sure that you have a light switch at the top of the stairs and the bottom of the stairs. If you do not have them, ask someone to add them for you. What else can I do to help prevent falls?  Wear shoes that:  Do not have high heels.  Have rubber bottoms.  Are comfortable and fit you well.  Are closed at the toe. Do not wear sandals.  If you use a stepladder:  Make sure that it is fully opened. Do not climb a closed stepladder.  Make sure that both sides of the stepladder are locked into place.  Ask someone to hold it for you, if possible.  Clearly mark and make sure that you can see:  Any grab bars or handrails.  First and last steps.  Where the edge of each step is.  Use tools that help you move around (mobility aids) if they are needed. These include:  Canes.  Walkers.  Scooters.  Crutches.  Turn on the lights when you go into a dark area. Replace any light bulbs as soon as they burn out.  Set up your furniture so you have a clear path. Avoid moving your furniture around.  If any of your floors are uneven, fix them.  If there are any pets around you, be aware of where they are.  Review your medicines with your doctor. Some medicines can make you feel dizzy. This can increase your chance of falling. Ask your doctor what other things that you can do to help prevent falls. This information is not intended to replace advice given to you by your health care provider. Make sure you discuss any questions you have with your health care provider. Document Released: 02/20/2009  Document Revised: 10/02/2015 Document Reviewed: 05/31/2014 Elsevier Interactive Patient Education  2017 [employer].

## 2019-12-04 NOTE — Progress Notes (Addendum)
I connected with Sandra Petersen today by telephone and verified that I am speaking with the correct person using two identifiers. Location patient: home Location provider: work Persons participating in the virtual visit: Sandra Petersen and [employer]. Carroll Lingelbach, LPN   I discussed the limitations, risks, security and privacy concerns of performing an evaluation and management service by telephone and the availability of in person appointments. I also discussed with the patient that there may be a patient responsible charge related to this service. The patient expressed understanding and verbally consented to this telephonic visit.    Interactive audio and video telecommunications were attempted between this provider and patient, however failed, due to patient having technical difficulties OR patient did not have access to video capability.  We continued and completed visit with audio only.  Some vital signs may be absent or patient reported.   Time Spent with patient on telephone encounter: 20 minutes  Subjective:   Sandra Petersen is a 69 y.o. female who presents for Medicare Annual (Subsequent) preventive examination.  Review of Systems    No ROS. Medicare Wellness Virtual Visit. Additional risk factors are reflected in social history. Cardiac Risk Factors include: advanced age (>9men, >64 women);diabetes mellitus;dyslipidemia;family history of premature cardiovascular disease;hypertension Sleep Patterns: No sleep issues, feels rested on waking and sleeps 8 hours nightly. Awakes to void 1-2 a night. Home Safety/Smoke Alarms: Feels safe in home; uses home alarm. Smoke alarms in place. Living environment: 1-story home; Lives with sister; no needs for DME; good support system. Seat Belt Safety/Bike Helmet: Wears seat belt.    Objective:    There were no vitals filed for this visit. There is no height or weight on file to calculate BMI.  Advanced Directives 12/04/2019 06/10/2016  05/26/2015 12/13/2014  Does Patient Have a Medical Advance Directive? No No No No  Would patient like information on creating a medical advance directive? No - Patient declined - No - patient declined information No - patient declined information    Current Medications (verified) Outpatient Encounter Medications as of 12/04/2019  Medication Sig   acetaminophen (TYLENOL) 325 MG tablet Take 650 mg by mouth every 6 (six) hours as needed (pain).   b complex vitamins tablet Take 1 tablet by mouth daily.   Cholecalciferol (VITAMIN D3) 1.25 MG (50000 UT) CAPS Take 1 capsule by mouth once a week.   Cholecalciferol (VITAMIN D3) 50 MCG (2000 UT) capsule Take 1 capsule (2,000 Units total) by mouth daily.   losartan (COZAAR) 100 MG tablet Take 1 tablet (100 mg total) by mouth daily.   meloxicam (MOBIC) 15 MG tablet Take 1 tablet (15 mg total) by mouth daily.   metFORMIN (GLUCOPHAGE) 500 MG tablet Take 1 tablet (500 mg total) by mouth 2 (two) times daily with a meal.   No facility-administered encounter medications on file as of 12/04/2019.    Allergies (verified) Patient has no known allergies.   History: Past Medical History:  Diagnosis Date   Chronic headaches    Diabetes mellitus without complication (HCC)    Past Surgical History:  Procedure Laterality Date   ABDOMINAL HYSTERECTOMY  1982   partial   Family History  Problem Relation Age of Onset   Diabetes Sister    Hypertension Other        Parent   Heart disease Mother    Kidney disease Mother    Social History   Socioeconomic History   Marital status: Legally Separated    Spouse name: Not  on file   Number of children: Not on file   Years of education: 12   Highest education level: Not on file  Occupational History   Occupation: [employer]  Tobacco Use   Smoking status: Never Smoker   Smokeless tobacco: Never Used  Substance and Sexual Activity   Alcohol use: No   Drug use: No   Sexual activity: Not Currently    Birth  control/protection: Post-menopausal  Other Topics Concern   Not on file  Social History Narrative   Regular exercise-yes   Caffeine Use-yes   Social Determinants of Health   Financial Resource Strain: Low Risk    Difficulty of Paying Living Expenses: Not hard at all  Food Insecurity: No Food Insecurity   Worried About Programme researcher, broadcasting/film/videounning Out of Food in the Last Year: Never true   Ran Out of Food in the Last Year: Never true  Transportation Needs: No Transportation Needs   Lack of Transportation (Medical): No   Lack of Transportation (Non-Medical): No  Physical Activity: Sufficiently Active   Days of Exercise per Week: 5 days   Minutes of Exercise per Session: 30 min  Stress: No Stress Concern Present   Feeling of Stress : Not at all  Social Connections: Moderately Integrated   Frequency of Communication with Friends and Family: More than three times a week   Frequency of Social Gatherings with Friends and Family: More than three times a week   Attends Religious Services: More than 4 times per year   Active Member of [employer]ubs or Organizations: Yes   Attends Engineer, structuralClub or Organization Meetings: More than 4 times per year   Marital Status: Never married    Tobacco Counseling Counseling given: Not Answered   Clinical Intake:  Pre-visit preparation completed: Yes  Pain : No/denies pain     Nutritional Risks: None Diabetes: Yes CBG done?: No Did pt. bring in CBG monitor from home?: No  How often do you need to have someone help you when you read instructions, pamphlets, or other written materials from your doctor or pharmacy?: 1 - Never What is the last grade level you completed in school?: HSG  Diabetic? yes  Interpreter Needed?: No  Information entered by :: Sandra Ohmer N. Mikko Lewellen, LPN   Activities of Daily Living In your present state of health, do you have any difficulty performing the following activities: 12/04/2019  Hearing? N  Vision? N  Difficulty concentrating or making  decisions? N  Walking or climbing stairs? N  Dressing or bathing? N  Doing errands, shopping? N  Preparing Food and eating ? N  Using the Toilet? N  In the past six months, have you accidently leaked urine? N  Do you have problems with loss of bowel control? N  Managing your Medications? N  Managing your Finances? N  Housekeeping or managing your Housekeeping? N  Some recent data might be hidden    Patient Care Team: Plotnikov, Georgina QuintAleksei V, MD as PCP - General (Internal Medicine)  Indicate any recent Medical Services you may have received from other than [facility] providers in the past year (date may be approximate).     Assessment:   This is a routine wellness examination for Aggie Cosierheresa.  Hearing/Vision screen No exam data present  Dietary issues and exercise activities discussed: Current Exercise Habits: Home exercise routine, Type of exercise: walking, Time (Minutes): 30, Frequency (Times/Week): 5, Weekly Exercise (Minutes/Week): 150, Intensity: Moderate, Exercise limited by: None identified  Goals       Client understands  the importance of follow-up with providers by attending scheduled visits (pt-stated)      To maintain my current health status by continuing to eat healthy and stay physically active & socially active.       Depression Screen PHQ 2/9 Scores 12/04/2019 06/18/2015 06/16/2012  PHQ - 2 Score 0 0 0    Fall Risk Fall Risk  12/04/2019 06/18/2015 06/16/2012  Falls in the past year? 0 No No  Number falls in past yr: 0 - -  Injury with Fall? 0 - -  Risk for fall due to : No Fall Risks - -  Follow up Falls evaluation completed - -    Any stairs in or around the home? Yes  If so, are there any without handrails? No  Home free of loose throw rugs in walkways, pet beds, electrical cords, etc? Yes  Adequate lighting in your home to reduce risk of falls? Yes   ASSISTIVE DEVICES UTILIZED TO PREVENT FALLS:  Life alert? No  Use of a cane, walker or w/c? No  Grab bars in the  bathroom? No  Shower chair or bench in shower? No  Elevated toilet seat or a handicapped toilet? No   TIMED UP AND GO:  Was the test performed? No .  Length of time to ambulate 10 feet: 0 sec.   Gait steady and fast without use of assistive device  Cognitive Function: Not indicated; Patient is cogitatively intact.         Immunizations Immunization History  Administered Date(s) Administered   PFIZER SARS-COV-2 Vaccination 06/18/2019, 07/13/2019    TDAP status: Due, Education has been provided regarding the importance of this vaccine. Advised may receive this vaccine at local pharmacy or Health Dept. Aware to provide a copy of the vaccination record if obtained from local pharmacy or Health Dept. Verbalized acceptance and understanding. Flu Vaccine status: Declined, Education has been provided regarding the importance of this vaccine but patient still declined. Advised may receive this vaccine at local pharmacy or Health Dept. Aware to provide a copy of the vaccination record if obtained from local pharmacy or Health Dept. Verbalized acceptance and understanding. Pneumococcal vaccine status: Declined,  Education has been provided regarding the importance of this vaccine but patient still declined. Advised may receive this vaccine at local pharmacy or Health Dept. Aware to provide a copy of the vaccination record if obtained from local pharmacy or Health Dept. Verbalized acceptance and understanding.  Covid-19 vaccine status: Completed vaccines  Qualifies for Shingles Vaccine? Yes   Zostavax completed No   Shingrix Completed?: No.    Education has been provided regarding the importance of this vaccine. Patient has been advised to call insurance company to determine out of pocket expense if they have not yet received this vaccine. Advised may also receive vaccine at local pharmacy or Health Dept. Verbalized acceptance and understanding.  Screening Tests Health Maintenance  Topic Date  Due   Hepatitis C Screening  Never done   FOOT EXAM  Never done   TETANUS/TDAP  Never done   OPHTHALMOLOGY EXAM  08/09/2014   MAMMOGRAM  01/18/2015   PNA vac Low Risk Adult (1 of 2 - PCV13) Never done   Fecal DNA (Cologuard)  09/12/2016   HEMOGLOBIN A1C  12/12/2018   URINE MICROALBUMIN  06/14/2019   INFLUENZA VACCINE  12/09/2019   DEXA SCAN  Completed   COVID-19 Vaccine  Completed    Health Maintenance  Health Maintenance Due  Topic Date Due   Hepatitis  C Screening  Never done   FOOT EXAM  Never done   TETANUS/TDAP  Never done   OPHTHALMOLOGY EXAM  08/09/2014   MAMMOGRAM  01/18/2015   PNA vac Low Risk Adult (1 of 2 - PCV13) Never done   Fecal DNA (Cologuard)  09/12/2016   HEMOGLOBIN A1C  12/12/2018   URINE MICROALBUMIN  06/14/2019    Colorectal screening: Patient agreed to do Cologuard; needs to see PCP for CPE. Mammogram status: Ordered refused. Pt provided with contact info and advised to call to schedule appt.  Patient refused Bone Density status: Ordered refused. Pt provided with contact info and advised to call to schedule appt. Patient refused  Lung Cancer Screening: (Low Dose CT Chest recommended if Age 66-80 years, 30 pack-year currently smoking OR have quit w/in 15years.) does not qualify.   Lung Cancer Screening Referral: no  Additional Screening:  Hepatitis C Screening: does qualify; Completed no  Vision Screening: Recommended annual ophthalmology exams for early detection of glaucoma and other disorders of the eye. Is the patient up to date with their annual eye exam?  Yes  Who is the provider or what is the name of the office in which the patient attends annual eye exams? Georges Mouse, MD at [facility] If pt is not established with a provider, would they like to be referred to a provider to establish care? No .   Dental Screening: Recommended annual dental exams for proper oral hygiene  Community Resource Referral / Chronic Care  Management: CRR required this visit?  No   CCM required this visit?  No      Plan:     I have personally reviewed and noted the following in the patient's chart:   Medical and social history Use of alcohol, tobacco or illicit drugs  Current medications and supplements Functional ability and status Nutritional status Physical activity Advanced directives List of other physicians Hospitalizations, surgeries, and ER visits in previous 12 months Vitals Screenings to include cognitive, depression, and falls Referrals and appointments  In addition, I have reviewed and discussed with patient certain preventive protocols, quality metrics, and best practice recommendations. A written personalized care plan for preventive services as well as general preventive health recommendations were provided to patient.     Mickeal Needy, LPN   06/05/5168   Nurse Notes:  Patient is cogitatively intact. There were no vitals filed for this visit. There is no height or weight on file to calculate BMI. Patient stated that she has no issues with gait or balance; does not use any assistive devices.   Medical screening examination/treatment/procedure(s) were performed by non-physician practitioner and as supervising physician I was immediately available for consultation/collaboration.  I agree with above. Jacinta Shoe, MD

## 2020-03-04 ENCOUNTER — Ambulatory Visit (INDEPENDENT_AMBULATORY_CARE_PROVIDER_SITE_OTHER): Payer: Medicare HMO | Admitting: Internal Medicine

## 2020-03-04 ENCOUNTER — Other Ambulatory Visit: Payer: Self-pay

## 2020-03-04 ENCOUNTER — Encounter: Payer: Self-pay | Admitting: Internal Medicine

## 2020-03-04 VITALS — BP 142/78 | HR 67 | Temp 98.5°F | Wt 128.1 lb

## 2020-03-04 DIAGNOSIS — E559 Vitamin D deficiency, unspecified: Secondary | ICD-10-CM | POA: Diagnosis not present

## 2020-03-04 DIAGNOSIS — Z0000 Encounter for general adult medical examination without abnormal findings: Secondary | ICD-10-CM

## 2020-03-04 DIAGNOSIS — E119 Type 2 diabetes mellitus without complications: Secondary | ICD-10-CM

## 2020-03-04 DIAGNOSIS — I10 Essential (primary) hypertension: Secondary | ICD-10-CM | POA: Diagnosis not present

## 2020-03-04 DIAGNOSIS — E538 Deficiency of other specified B group vitamins: Secondary | ICD-10-CM

## 2020-03-04 LAB — TSH: TSH: 2.19 u[IU]/mL (ref 0.35–4.50)

## 2020-03-04 LAB — URINALYSIS
Bilirubin Urine: NEGATIVE
Ketones, ur: NEGATIVE
Leukocytes,Ua: NEGATIVE
Nitrite: NEGATIVE
Specific Gravity, Urine: 1.01 (ref 1.000–1.030)
Total Protein, Urine: NEGATIVE
Urine Glucose: >=1000 — AB
Urobilinogen, UA: 0.2 (ref 0.0–1.0)
pH: 6.5 (ref 5.0–8.0)

## 2020-03-04 LAB — CBC WITH DIFFERENTIAL/PLATELET
Basophils Absolute: 0 10*3/uL (ref 0.0–0.1)
Basophils Relative: 0.5 % (ref 0.0–3.0)
Eosinophils Absolute: 0.3 10*3/uL (ref 0.0–0.7)
Eosinophils Relative: 4.9 % (ref 0.0–5.0)
HCT: 37.1 % (ref 36.0–46.0)
Hemoglobin: 12.5 g/dL (ref 12.0–15.0)
Lymphocytes Relative: 43.5 % (ref 12.0–46.0)
Lymphs Abs: 2.9 10*3/uL (ref 0.7–4.0)
MCHC: 33.8 g/dL (ref 30.0–36.0)
MCV: 89.9 fl (ref 78.0–100.0)
Monocytes Absolute: 0.5 10*3/uL (ref 0.1–1.0)
Monocytes Relative: 7.3 % (ref 3.0–12.0)
Neutro Abs: 2.9 10*3/uL (ref 1.4–7.7)
Neutrophils Relative %: 43.8 % (ref 43.0–77.0)
Platelets: 243 10*3/uL (ref 150.0–400.0)
RBC: 4.13 Mil/uL (ref 3.87–5.11)
RDW: 12.4 % (ref 11.5–15.5)
WBC: 6.6 10*3/uL (ref 4.0–10.5)

## 2020-03-04 LAB — COMPREHENSIVE METABOLIC PANEL
ALT: 13 U/L (ref 0–35)
AST: 16 U/L (ref 0–37)
Albumin: 4.4 g/dL (ref 3.5–5.2)
Alkaline Phosphatase: 112 U/L (ref 39–117)
BUN: 8 mg/dL (ref 6–23)
CO2: 32 mEq/L (ref 19–32)
Calcium: 9.6 mg/dL (ref 8.4–10.5)
Chloride: 97 mEq/L (ref 96–112)
Creatinine, Ser: 0.62 mg/dL (ref 0.40–1.20)
GFR: 90.52 mL/min (ref >60.00)
Glucose, Bld: 262 mg/dL — ABNORMAL HIGH (ref 70–99)
Potassium: 3.4 mEq/L — ABNORMAL LOW (ref 3.5–5.1)
Sodium: 136 mEq/L (ref 135–145)
Total Bilirubin: 0.8 mg/dL (ref 0.2–1.2)
Total Protein: 7.7 g/dL (ref 6.0–8.3)

## 2020-03-04 LAB — HEMOGLOBIN A1C: Hgb A1c MFr Bld: 16.3 % — ABNORMAL HIGH (ref 4.6–6.5)

## 2020-03-04 LAB — LIPID PANEL
Cholesterol: 178 mg/dL (ref 0–200)
HDL: 59.4 mg/dL (ref >39.00)
LDL Cholesterol: 105 mg/dL — ABNORMAL HIGH (ref 0–99)
NonHDL: 118.54
Total CHOL/HDL Ratio: 3
Triglycerides: 70 mg/dL (ref 0.0–149.0)
VLDL: 14 mg/dL (ref 0.0–40.0)

## 2020-03-04 LAB — VITAMIN B12: Vitamin B-12: 316 pg/mL (ref 211–911)

## 2020-03-04 LAB — MICROALBUMIN / CREATININE URINE RATIO
Creatinine,U: 46.2 mg/dL
Microalb Creat Ratio: 3.5 mg/g (ref 0.0–30.0)
Microalb, Ur: 1.6 mg/dL (ref 0.0–1.9)

## 2020-03-04 LAB — VITAMIN D 25 HYDROXY (VIT D DEFICIENCY, FRACTURES): VITD: 17.83 ng/mL — ABNORMAL LOW (ref 30.00–100.00)

## 2020-03-04 MED ORDER — LOSARTAN POTASSIUM 100 MG PO TABS: 100.0000 mg | ORAL_TABLET | Freq: Every day | ORAL | 3 refills | Status: DC

## 2020-03-04 MED ORDER — METFORMIN HCL 500 MG PO TABS: 500.0000 mg | ORAL_TABLET | Freq: Two times a day (BID) | ORAL | 3 refills | Status: DC

## 2020-03-04 MED ORDER — METFORMIN HCL 500 MG PO TABS: 500.0000 mg | ORAL_TABLET | Freq: Two times a day (BID) | ORAL | 11 refills | Status: DC

## 2020-03-04 NOTE — Assessment & Plan Note (Signed)
On B12 

## 2020-03-04 NOTE — Progress Notes (Signed)
Subjective:  Patient ID: Sandra Petersen, female    DOB: 1951-03-16  Age: 69 y.o. MRN: [id number]  CC: Annual Exam   HPI Sandra Petersen presents for well F/u DM, HTN, vitamin def Pt ran out of meds  Outpatient Medications Prior to Visit  Medication Sig Dispense Refill  . acetaminophen (TYLENOL) 325 MG tablet Take 650 mg by mouth every 6 (six) hours as needed (pain).    Marland Kitchen b complex vitamins tablet Take 1 tablet by mouth daily. 100 tablet 3  . Cholecalciferol (VITAMIN D3) 1.25 MG (50000 UT) CAPS Take 1 capsule by mouth once a week. 8 capsule 0  . Cholecalciferol (VITAMIN D3) 50 MCG (2000 UT) capsule Take 1 capsule (2,000 Units total) by mouth daily. 100 capsule 3  . meloxicam (MOBIC) 15 MG tablet Take 1 tablet (15 mg total) by mouth daily. 30 tablet 0  . metFORMIN (GLUCOPHAGE) 500 MG tablet Take 1 tablet (500 mg total) by mouth 2 (two) times daily with a meal. 60 tablet 0  . losartan (COZAAR) 100 MG tablet Take 1 tablet (100 mg total) by mouth daily. 30 tablet 11   No facility-administered medications prior to visit.    ROS: Review of Systems  Constitutional: Negative for activity change, appetite change, chills, fatigue and unexpected weight change.  HENT: Negative for congestion, mouth sores and sinus pressure.   Eyes: Negative for visual disturbance.  Respiratory: Negative for cough and chest tightness.   Gastrointestinal: Negative for abdominal pain and nausea.  Genitourinary: Negative for difficulty urinating, frequency and vaginal pain.  Musculoskeletal: Negative for back pain and gait problem.  Skin: Negative for pallor and rash.  Neurological: Negative for dizziness, tremors, weakness, numbness and headaches.  Psychiatric/Behavioral: Negative for confusion and sleep disturbance.    Objective:  BP (!) 142/78 (BP Location: Left Arm)   Pulse 67   Temp 98.5 F (36.9 C) (Oral)   Wt 128 lb 1.9 oz (58.1 kg)   SpO2 97%   BMI 23.43 kg/m   BP Readings from Last 3  Encounters:  03/04/20 (!) 142/78  06/13/18 136/78  06/10/16 143/76    Wt Readings from Last 3 Encounters:  03/04/20 128 lb 1.9 oz (58.1 kg)  06/13/18 157 lb (71.2 kg)  06/10/16 135 lb (61.2 kg)    Physical Exam Constitutional:      General: She is not in acute distress.    Appearance: She is well-developed.  HENT:     Head: Normocephalic.     Right Ear: External ear normal.     Left Ear: External ear normal.     Nose: Nose normal.  Eyes:     General:        Right eye: No discharge.        Left eye: No discharge.     Conjunctiva/sclera: Conjunctivae normal.     Pupils: Pupils are equal, round, and reactive to light.  Neck:     Thyroid: No thyromegaly.     Vascular: No JVD.     Trachea: No tracheal deviation.  Cardiovascular:     Rate and Rhythm: Normal rate and regular rhythm.     Heart sounds: Normal heart sounds.  Pulmonary:     Effort: No respiratory distress.     Breath sounds: No stridor. No wheezing.  Abdominal:     General: Bowel sounds are normal. There is no distension.     Palpations: Abdomen is soft. There is no mass.     Tenderness: There  is no abdominal tenderness. There is no guarding or rebound.  Musculoskeletal:        General: No tenderness.     Cervical back: Normal range of motion and neck supple.  Lymphadenopathy:     Cervical: No cervical adenopathy.  Skin:    Findings: No erythema or rash.  Neurological:     Mental Status: She is oriented to person, place, and time.     Cranial Nerves: No cranial nerve deficit.     Motor: No abnormal muscle tone.     Coordination: Coordination normal.     Deep Tendon Reflexes: Reflexes normal.  Psychiatric:        Behavior: Behavior normal.        Thought Content: Thought content normal.        Judgment: Judgment normal.    I spent 22 minutes in addition to time for CPX wellness examination in preparing to see the patient by review of recent labs, imaging and procedures, obtaining and reviewing  separately obtained history, communicating with the patient, ordering medications, tests or procedures, and documenting clinical information in the EHR including the differential diagnosis, treatment, and any further evaluation and other management of Vit def, HTN, DM.         Lab Results  Component Value Date   WBC 7.9 06/13/2018   HGB 12.4 06/13/2018   HCT 36.8 06/13/2018   PLT 257.0 06/13/2018   GLUCOSE 130 (H) 06/13/2018   CHOL 162 06/13/2018   TRIG 138.0 06/13/2018   HDL 44.70 06/13/2018   LDLCALC 89 06/13/2018   ALT 18 06/13/2018   AST 16 06/13/2018   NA 141 06/13/2018   K 4.3 06/13/2018   CL 104 06/13/2018   CREATININE 0.84 06/13/2018   BUN 12 06/13/2018   CO2 31 06/13/2018   TSH 2.59 06/13/2018   HGBA1C 8.4 (H) 06/13/2018   MICROALBUR 1.7 06/13/2018    DG Chest 2 View  Result Date: 06/10/2016 CLINICAL DATA:  Cough, chest pain. EXAM: CHEST  2 VIEW COMPARISON:  Radiographs of May 09, 2013. FINDINGS: The heart size and mediastinal contours are within normal limits. Both lungs are clear. No pneumothorax or pleural effusion is noted. The visualized skeletal structures are unremarkable. IMPRESSION: No active cardiopulmonary disease. Electronically Signed   By: Lupita Raider, M.D.   On: 06/10/2016 09:43    Assessment & Plan:   There are no diagnoses linked to this encounter.   Meds ordered this encounter  Medications  . metFORMIN (GLUCOPHAGE) 500 MG tablet    Sig: Take 1 tablet (500 mg total) by mouth 2 (two) times daily with a meal.    Dispense:  60 tablet    Refill:  11     Follow-up: No follow-ups on file.  Sonda Primes, MD

## 2020-03-04 NOTE — Assessment & Plan Note (Signed)
Re-start Vit D Risks associated with treatment noncompliance were discussed. Compliance was encouraged.  

## 2020-03-04 NOTE — Assessment & Plan Note (Addendum)
Labs Re-start Metformin

## 2020-03-04 NOTE — Assessment & Plan Note (Signed)
Worse. Re-start Losartan 

## 2020-03-04 NOTE — Assessment & Plan Note (Addendum)
  We discussed age appropriate health related issues, including available/recomended screening tests and vaccinations. Labs were ordered to be later reviewed . All questions were answered. We discussed one or more of the following - seat belt use, use of sunscreen/sun exposure exercise, safe sex, fall risk reduction, second hand smoke exposure, firearm use and storage, seat belt use, a need for adhering to healthy diet and exercise. Labs were ordered.  All questions were answered. Cologuard - recent cardiac CT scan for calcium scoring offered 2/20  S/p partial hysterect in 1982  Refused shots, cologuard Ophth, mammo q 12-24 mo    We discussed age appropriate health related issues, including available/recomended screening tests and vaccinations. Labs were ordered to be later reviewed . All questions were answered. We discussed one or more of the following - seat belt use, use of sunscreen/sun exposure exercise, safe sex, fall risk reduction, second hand smoke exposure, firearm use and storage, seat belt use, a need for adhering to healthy diet and exercise. Labs were ordered.  All questions were answered.

## 2020-03-10 ENCOUNTER — Other Ambulatory Visit: Payer: Self-pay | Admitting: Internal Medicine

## 2020-03-10 MED ORDER — REPAGLINIDE 1 MG PO TABS: 1.0000 mg | ORAL_TABLET | Freq: Three times a day (TID) | ORAL | 5 refills | Status: DC

## 2020-03-10 MED ORDER — VITAMIN D3 1.25 MG (50000 UT) PO CAPS: 1.0000 | ORAL_CAPSULE | ORAL | 0 refills | Status: DC

## 2020-10-23 DIAGNOSIS — H25812 Combined forms of age-related cataract, left eye: Secondary | ICD-10-CM | POA: Diagnosis not present

## 2020-10-23 DIAGNOSIS — Z01818 Encounter for other preprocedural examination: Secondary | ICD-10-CM | POA: Diagnosis not present

## 2020-10-23 DIAGNOSIS — E119 Type 2 diabetes mellitus without complications: Secondary | ICD-10-CM | POA: Diagnosis not present

## 2020-10-31 DIAGNOSIS — H2512 Age-related nuclear cataract, left eye: Secondary | ICD-10-CM | POA: Diagnosis not present

## 2020-10-31 DIAGNOSIS — H25812 Combined forms of age-related cataract, left eye: Secondary | ICD-10-CM | POA: Diagnosis not present

## 2021-01-22 ENCOUNTER — Encounter: Payer: Self-pay | Admitting: Pharmacist

## 2021-01-22 DIAGNOSIS — Z9189 Other specified personal risk factors, not elsewhere classified: Secondary | ICD-10-CM

## 2021-01-22 NOTE — Progress Notes (Signed)
[facility])                                            [facility] Quality Pharmacy Team                                        Statin Quality Measure Assessment    01/22/2021  Sandra Petersen 28-Feb-1951 [id number]  Per review of chart and payor information, this patient has been flagged for non-adherence to the following CMS Quality Measure:   [x]  Statin Use in Persons with Diabetes (SUPD)   []  Statin Use in Persons with Cardiovascular Disease  The 10-year ASCVD risk score (Arnett DK, et al., 2019) is: 28.7%   Values used to calculate the score:     Age: 70 years     Sex: Female     Is Non-Hispanic African American: Yes     Diabetic: Yes     Tobacco smoker: No     Systolic Blood Pressure: 142 mmHg     Is BP treated: Yes     HDL Cholesterol: 59.4 mg/dL     Total Cholesterol: 178 mg/dL  2020  Currently prescribed statin:  []  Yes [x]  No     History of statin use:            []  Yes [x]  No     Please consider statin initiation if clinically appropriate based on ASCVD risk score > 7.5%.   If prior intolerance, consider associating appropriate exclusion code if warranted.  Some examples below:  Drug Induced Myopathy G72.0  Myositis, unspecified M60.9  Rhabdomyolysis M62.82  Prediabetes R73.03  Adverse effect of antihyperlipidemic and antiarteriosclerotic drugs, initial encounter T46.6X5A     Thank you for your time,  65, PharmD, [facility] Psychiatric Clinical Pharmacist [facility] [phone]

## 2021-03-11 ENCOUNTER — Telehealth: Payer: Self-pay | Admitting: Internal Medicine

## 2021-03-11 NOTE — Telephone Encounter (Signed)
LVM for pt to rtn my call to schedule AWV with [facility]. Please schedule AWV if pt calls the office  

## 2021-05-30 ENCOUNTER — Other Ambulatory Visit: Payer: Self-pay | Admitting: Internal Medicine

## 2021-11-11 ENCOUNTER — Ambulatory Visit (INDEPENDENT_AMBULATORY_CARE_PROVIDER_SITE_OTHER): Payer: Medicare HMO | Admitting: Internal Medicine

## 2021-11-11 ENCOUNTER — Encounter: Payer: Self-pay | Admitting: Internal Medicine

## 2021-11-11 VITALS — BP 140/80 | HR 68 | Temp 98.3°F | Ht 62.0 in | Wt 121.0 lb

## 2021-11-11 DIAGNOSIS — Z0000 Encounter for general adult medical examination without abnormal findings: Secondary | ICD-10-CM

## 2021-11-11 DIAGNOSIS — E538 Deficiency of other specified B group vitamins: Secondary | ICD-10-CM | POA: Diagnosis not present

## 2021-11-11 DIAGNOSIS — E119 Type 2 diabetes mellitus without complications: Secondary | ICD-10-CM

## 2021-11-11 DIAGNOSIS — I10 Essential (primary) hypertension: Secondary | ICD-10-CM | POA: Diagnosis not present

## 2021-11-11 DIAGNOSIS — Z91199 Patient's noncompliance with other medical treatment and regimen due to unspecified reason: Secondary | ICD-10-CM | POA: Diagnosis not present

## 2021-11-11 DIAGNOSIS — Z1211 Encounter for screening for malignant neoplasm of colon: Secondary | ICD-10-CM

## 2021-11-11 DIAGNOSIS — E559 Vitamin D deficiency, unspecified: Secondary | ICD-10-CM

## 2021-11-11 LAB — COMPREHENSIVE METABOLIC PANEL WITH GFR
ALT: 15 U/L (ref 0–35)
AST: 14 U/L (ref 0–37)
Albumin: 4.5 g/dL (ref 3.5–5.2)
Alkaline Phosphatase: 99 U/L (ref 39–117)
BUN: 14 mg/dL (ref 6–23)
CO2: 29 meq/L (ref 19–32)
Calcium: 9.9 mg/dL (ref 8.4–10.5)
Chloride: 97 meq/L (ref 96–112)
Creatinine, Ser: 0.6 mg/dL (ref 0.40–1.20)
GFR: 90.16 mL/min
Glucose, Bld: 281 mg/dL — ABNORMAL HIGH (ref 70–99)
Potassium: 3.5 meq/L (ref 3.5–5.1)
Sodium: 137 meq/L (ref 135–145)
Total Bilirubin: 0.7 mg/dL (ref 0.2–1.2)
Total Protein: 8.1 g/dL (ref 6.0–8.3)

## 2021-11-11 LAB — CBC WITH DIFFERENTIAL/PLATELET
Basophils Absolute: 0 10*3/uL (ref 0.0–0.1)
Basophils Relative: 0.5 % (ref 0.0–3.0)
Eosinophils Absolute: 0.2 10*3/uL (ref 0.0–0.7)
Eosinophils Relative: 2.6 % (ref 0.0–5.0)
HCT: 36.9 % (ref 36.0–46.0)
Hemoglobin: 12.3 g/dL (ref 12.0–15.0)
Lymphocytes Relative: 37 % (ref 12.0–46.0)
Lymphs Abs: 2.8 10*3/uL (ref 0.7–4.0)
MCHC: 33.5 g/dL (ref 30.0–36.0)
MCV: 89 fl (ref 78.0–100.0)
Monocytes Absolute: 0.6 10*3/uL (ref 0.1–1.0)
Monocytes Relative: 8 % (ref 3.0–12.0)
Neutro Abs: 4 10*3/uL (ref 1.4–7.7)
Neutrophils Relative %: 51.9 % (ref 43.0–77.0)
Platelets: 253 10*3/uL (ref 150.0–400.0)
RBC: 4.14 Mil/uL (ref 3.87–5.11)
RDW: 12.8 % (ref 11.5–15.5)
WBC: 7.6 10*3/uL (ref 4.0–10.5)

## 2021-11-11 LAB — URINALYSIS
Bilirubin Urine: NEGATIVE
Hgb urine dipstick: NEGATIVE
Ketones, ur: NEGATIVE
Leukocytes,Ua: NEGATIVE
Nitrite: NEGATIVE
Specific Gravity, Urine: 1.01 (ref 1.000–1.030)
Total Protein, Urine: NEGATIVE
Urine Glucose: >=1000 — AB
Urobilinogen, UA: 0.2 (ref 0.0–1.0)
pH: 8 (ref 5.0–8.0)

## 2021-11-11 LAB — LIPID PANEL
Cholesterol: 175 mg/dL (ref 0–200)
HDL: 56.7 mg/dL
LDL Cholesterol: 102 mg/dL — ABNORMAL HIGH (ref 0–99)
NonHDL: 118.31
Total CHOL/HDL Ratio: 3
Triglycerides: 84 mg/dL (ref 0.0–149.0)
VLDL: 16.8 mg/dL (ref 0.0–40.0)

## 2021-11-11 LAB — VITAMIN B12: Vitamin B-12: 363 pg/mL (ref 211–911)

## 2021-11-11 LAB — MICROALBUMIN / CREATININE URINE RATIO
Creatinine,U: 66.8 mg/dL
Microalb Creat Ratio: 6.8 mg/g (ref 0.0–30.0)
Microalb, Ur: 4.6 mg/dL — ABNORMAL HIGH (ref 0.0–1.9)

## 2021-11-11 LAB — VITAMIN D 25 HYDROXY (VIT D DEFICIENCY, FRACTURES): VITD: 18.92 ng/mL — ABNORMAL LOW (ref 30.00–100.00)

## 2021-11-11 LAB — HEMOGLOBIN A1C: Hgb A1c MFr Bld: 15.1 % — ABNORMAL HIGH (ref 4.6–6.5)

## 2021-11-11 LAB — TSH: TSH: 2.49 u[IU]/mL (ref 0.35–5.50)

## 2021-11-11 MED ORDER — LOSARTAN POTASSIUM 100 MG PO TABS: 100.0000 mg | ORAL_TABLET | Freq: Every day | ORAL | 3 refills | Status: AC

## 2021-11-11 MED ORDER — REPAGLINIDE 1 MG PO TABS: 1.0000 mg | ORAL_TABLET | Freq: Three times a day (TID) | ORAL | 5 refills | Status: AC

## 2021-11-11 NOTE — Assessment & Plan Note (Signed)
On Metformin - d/c 2023 cardiac CT scan for calcium scoring offered  Prandin po to re-start Risks associated with treatment noncompliance were discussed. Compliance was encouraged.

## 2021-11-11 NOTE — Assessment & Plan Note (Signed)
Check Vit D  Not taking Vit D

## 2021-11-11 NOTE — Assessment & Plan Note (Signed)
Check B12 

## 2021-11-11 NOTE — Progress Notes (Signed)
Subjective:  Patient ID: Sandra Petersen, female    DOB: [birth year]  Age: 71 y.o. MRN: [id number]  CC: No chief complaint on file.   HPI [name] presents for a well exam  F/u on DM, HTN C/o nausea w/Metformin - wants to stop  Outpatient Medications Prior to Visit  Medication Sig Dispense Refill   acetaminophen (TYLENOL) 325 MG tablet Take 650 mg by mouth every 6 (six) hours as needed (pain).     b complex vitamins tablet Take 1 tablet by mouth daily. 100 tablet 3   Cholecalciferol (VITAMIN D3) 1.25 MG (50000 UT) CAPS Take 1 capsule by mouth once a week. 8 capsule 0   Cholecalciferol (VITAMIN D3) 50 MCG (2000 UT) capsule Take 1 capsule (2,000 Units total) by mouth daily. 100 capsule 3   meloxicam (MOBIC) 15 MG tablet Take 1 tablet (15 mg total) by mouth daily. 30 tablet 0   metFORMIN (GLUCOPHAGE) 500 MG tablet TAKE 1 TABLET(500 MG) BY MOUTH TWICE DAILY WITH A MEAL 180 tablet 0   repaglinide (PRANDIN) 1 MG tablet Take 1 tablet (1 mg total) by mouth 3 (three) times daily before meals. 90 tablet 5   losartan (COZAAR) 100 MG tablet Take 1 tablet (100 mg total) by mouth daily. 90 tablet 3   No facility-administered medications prior to visit.    ROS: Review of Systems  Constitutional:  Positive for unexpected weight change. Negative for activity change, appetite change, chills and fatigue.  HENT:  Negative for congestion, mouth sores and sinus pressure.   Eyes:  Negative for visual disturbance.  Respiratory:  Negative for cough and chest tightness.   Gastrointestinal:  Positive for nausea. Negative for abdominal pain.  Genitourinary:  Negative for difficulty urinating, frequency and vaginal pain.  Musculoskeletal:  Negative for back pain and gait problem.  Skin:  Negative for pallor and rash.  Neurological:  Negative for dizziness, tremors, weakness, numbness and headaches.  Psychiatric/Behavioral:  Negative for confusion and sleep disturbance.     Objective:  BP  140/80 (BP Location: Left Arm, Patient Position: Sitting, Cuff Size: Normal)   Pulse 68   Temp 98.3 F (36.8 C) (Oral)   Ht 5\' 2"  (1.575 m)   Wt 121 lb (54.9 kg)   SpO2 97%   BMI 22.13 kg/m   BP Readings from Last 3 Encounters:  11/11/21 140/80  03/04/20 (!) 142/78  06/13/18 136/78    Wt Readings from Last 3 Encounters:  11/11/21 121 lb (54.9 kg)  03/04/20 128 lb 1.9 oz (58.1 kg)  06/13/18 157 lb (71.2 kg)    Physical Exam Constitutional:      General: She is not in acute distress.    Appearance: Normal appearance. She is well-developed.  HENT:     Head: Normocephalic.     Right Ear: External ear normal.     Left Ear: External ear normal.     Nose: Nose normal.  Eyes:     General:        Right eye: No discharge.        Left eye: No discharge.     Conjunctiva/sclera: Conjunctivae normal.     Pupils: Pupils are equal, round, and reactive to light.  Neck:     Thyroid: No thyromegaly.     Vascular: No JVD.     Trachea: No tracheal deviation.  Cardiovascular:     Rate and Rhythm: Normal rate and regular rhythm.     Heart sounds: Normal heart sounds.  Pulmonary:     Effort: No respiratory distress.     Breath sounds: No stridor. No wheezing.  Abdominal:     General: Bowel sounds are normal. There is no distension.     Palpations: Abdomen is soft. There is no mass.     Tenderness: There is no abdominal tenderness. There is no guarding or rebound.  Musculoskeletal:        General: No tenderness.     Cervical back: Normal range of motion and neck supple. No rigidity.  Lymphadenopathy:     Cervical: No cervical adenopathy.  Skin:    Findings: No erythema or rash.  Neurological:     Mental Status: She is oriented to person, place, and time.     Cranial Nerves: No cranial nerve deficit.     Motor: No abnormal muscle tone.     Coordination: Coordination normal.     Deep Tendon Reflexes: Reflexes normal.  Psychiatric:        Behavior: Behavior normal.         Thought Content: Thought content normal.        Judgment: Judgment normal.    I spent 22 minutes in addition to time for CPX wellness examination in preparing to see the patient by review of recent labs, imaging and procedures, obtaining and reviewing separately obtained history, communicating with the patient, ordering medications, tests or procedures, and documenting clinical information in the EHR including the differential diagnosis, treatment, and any further evaluation and other management of DM, HTN, Vit B12 and D def, compliance         Lab Results  Component Value Date   WBC 7.6 11/11/2021   HGB 12.3 11/11/2021   HCT 36.9 11/11/2021   PLT 253.0 11/11/2021   GLUCOSE 281 (H) 11/11/2021   CHOL 175 11/11/2021   TRIG 84.0 11/11/2021   HDL 56.70 11/11/2021   LDLCALC 102 (H) 11/11/2021   ALT 15 11/11/2021   AST 14 11/11/2021   NA 137 11/11/2021   K 3.5 11/11/2021   CL 97 11/11/2021   CREATININE 0.60 11/11/2021   BUN 14 11/11/2021   CO2 29 11/11/2021   TSH 2.49 11/11/2021   HGBA1C 15.1 (H) 11/11/2021   MICROALBUR 4.6 (H) 11/11/2021    DG Chest 2 View  Result Date: 06/10/2016 CLINICAL DATA:  Cough, chest pain. EXAM: CHEST  2 VIEW COMPARISON:  Radiographs of May 09, 2013. FINDINGS: The heart size and mediastinal contours are within normal limits. Both lungs are clear. No pneumothorax or pleural effusion is noted. The visualized skeletal structures are unremarkable. IMPRESSION: No active cardiopulmonary disease. Electronically Signed   By: Lupita Raider, M.D.   On: 06/10/2016 09:43    Assessment & Plan:   Problem List Items Addressed This Visit     B12 deficiency    Check B12      Relevant Orders   Vitamin B12 (Completed)   DM type 2, not at goal [facility])    On Metformin - d/c 2023 cardiac CT scan for calcium scoring offered  Prandin po to re-start Risks associated with treatment noncompliance were discussed. Compliance was encouraged.       Relevant  Medications   repaglinide (PRANDIN) 1 MG tablet   losartan (COZAAR) 100 MG tablet   Other Relevant Orders   Hemoglobin A1c (Completed)   Microalbumin / creatinine urine ratio (Completed)   Essential hypertension    Monitor BP      Relevant Medications   losartan (COZAAR) 100  MG tablet   Noncompliance    Risks associated with treatment noncompliance were discussed. Compliance was encouraged.      Vitamin D deficiency    Check Vit D  Not taking Vit D      Relevant Orders   VITAMIN D 25 Hydroxy (Vit-D Deficiency, Fractures) (Completed)   Well adult exam - Primary     We discussed age appropriate health related issues, including available/recomended screening tests and vaccinations. Labs were ordered to be later reviewed . All questions were answered. We discussed one or more of the following - seat belt use, use of sunscreen/sun exposure exercise, fall risk reduction, second hand smoke exposure, firearm use and storage, seat belt use, a need for adhering to healthy diet and exercise. Labs were ordered.  All questions were answered. Risks associated with treatment noncompliance were discussed. Compliance was encouraged. Cologuard advised Ophth, mammo q 12-24 mo - pt was asked to schedule       Relevant Orders   TSH (Completed)   Urinalysis (Completed)   CBC with Differential/Platelet (Completed)   Lipid panel (Completed)   Comprehensive metabolic panel (Completed)   Hemoglobin A1c (Completed)   Cologuard   Microalbumin / creatinine urine ratio (Completed)   Other Visit Diagnoses     Colon cancer screening       Relevant Orders   Cologuard         Meds ordered this encounter  Medications   repaglinide (PRANDIN) 1 MG tablet    Sig: Take 1 tablet (1 mg total) by mouth 3 (three) times daily before meals.    Dispense:  90 tablet    Refill:  5   losartan (COZAAR) 100 MG tablet    Sig: Take 1 tablet (100 mg total) by mouth daily.    Dispense:  90 tablet    Refill:   3      Follow-up: Return in about 4 months (around 03/14/2022) for a follow-up visit.  Sonda Primes, MD

## 2021-11-11 NOTE — Assessment & Plan Note (Signed)
Risks associated with treatment noncompliance were discussed. Compliance was encouraged. 

## 2021-11-11 NOTE — Assessment & Plan Note (Signed)
  We discussed age appropriate health related issues, including available/recomended screening tests and vaccinations. Labs were ordered to be later reviewed . All questions were answered. We discussed one or more of the following - seat belt use, use of sunscreen/sun exposure exercise, fall risk reduction, second hand smoke exposure, firearm use and storage, seat belt use, a need for adhering to healthy diet and exercise. Labs were ordered.  All questions were answered. Risks associated with treatment noncompliance were discussed. Compliance was encouraged. Cologuard advised Ophth, mammo q 12-24 mo - pt was asked to schedule

## 2021-11-11 NOTE — Assessment & Plan Note (Signed)
Monitor BP 

## 2021-11-16 ENCOUNTER — Other Ambulatory Visit: Payer: Self-pay | Admitting: Internal Medicine

## 2021-11-16 MED ORDER — VITAMIN D3 1.25 MG (50000 UT) PO CAPS: 1.0000 | ORAL_CAPSULE | ORAL | 0 refills | Status: AC

## 2021-12-17 DIAGNOSIS — H6123 Impacted cerumen, bilateral: Secondary | ICD-10-CM | POA: Diagnosis not present

## 2021-12-17 DIAGNOSIS — H93293 Other abnormal auditory perceptions, bilateral: Secondary | ICD-10-CM | POA: Diagnosis not present

## 2021-12-23 DIAGNOSIS — Z1231 Encounter for screening mammogram for malignant neoplasm of breast: Secondary | ICD-10-CM | POA: Diagnosis not present

## 2021-12-23 LAB — HM MAMMOGRAPHY

## 2021-12-31 ENCOUNTER — Encounter: Payer: Self-pay | Admitting: Internal Medicine

## 2021-12-31 DIAGNOSIS — E119 Type 2 diabetes mellitus without complications: Secondary | ICD-10-CM | POA: Diagnosis not present

## 2021-12-31 DIAGNOSIS — H26493 Other secondary cataract, bilateral: Secondary | ICD-10-CM | POA: Diagnosis not present

## 2021-12-31 DIAGNOSIS — H43812 Vitreous degeneration, left eye: Secondary | ICD-10-CM | POA: Diagnosis not present

## 2022-02-11 ENCOUNTER — Ambulatory Visit: Payer: Medicare HMO | Admitting: Internal Medicine

## 2022-02-22 ENCOUNTER — Ambulatory Visit: Payer: Medicare HMO | Admitting: Internal Medicine

## 2022-03-09 ENCOUNTER — Telehealth: Payer: Self-pay | Admitting: Internal Medicine

## 2022-03-09 NOTE — Telephone Encounter (Signed)
N/A unable to leave a message for patient to call back to schedule Medicare Annual Wellness Visit   Last AWV  12/04/19  Please schedule at anytime with [facility] if patient calls the office back.    40 Minutes appointment   Any questions, please call me at [phone]

## 2022-03-17 ENCOUNTER — Telehealth: Payer: Self-pay | Admitting: Internal Medicine

## 2022-03-17 NOTE — Telephone Encounter (Signed)
Called to schedule AWV with [facility], but voice mail box was full.  

## 2022-03-24 ENCOUNTER — Encounter (HOSPITAL_COMMUNITY): Payer: Self-pay

## 2022-03-24 ENCOUNTER — Ambulatory Visit (HOSPITAL_COMMUNITY)
Admission: EM | Admit: 2022-03-24 | Discharge: 2022-03-24 | Disposition: A | Discharge status: Home / Self Care | Payer: Medicare HMO | Attending: Emergency Medicine | Admitting: Emergency Medicine

## 2022-03-24 DIAGNOSIS — J069 Acute upper respiratory infection, unspecified: Secondary | ICD-10-CM

## 2022-03-24 MED ORDER — BENZONATATE 100 MG PO CAPS: 100.0000 mg | ORAL_CAPSULE | Freq: Three times a day (TID) | ORAL | 0 refills | Status: AC

## 2022-03-24 MED ORDER — PROMETHAZINE-DM 6.25-15 MG/5ML PO SYRP: 5.0000 mL | ORAL_SOLUTION | Freq: Four times a day (QID) | ORAL | 0 refills | Status: AC | PRN

## 2022-03-24 MED ORDER — METHYLPREDNISOLONE SODIUM SUCC 125 MG IJ SOLR
INTRAMUSCULAR | Status: AC
  Filled 2022-03-24: qty 2

## 2022-03-24 MED ORDER — AZITHROMYCIN 250 MG PO TABS: 250.0000 mg | ORAL_TABLET | Freq: Every day | ORAL | 0 refills | Status: AC

## 2022-03-24 MED ORDER — METHYLPREDNISOLONE SODIUM SUCC 125 MG IJ SOLR
60.0000 mg | Freq: Once | INTRAMUSCULAR | Status: AC
  Administered 2022-03-24: 60 mg via INTRAMUSCULAR

## 2022-03-24 NOTE — ED Provider Notes (Signed)
[facility]    CSN: [id number] Arrival date & time: 03/24/22  1315      History   Chief Complaint Chief Complaint  Patient presents with   Cough   Nasal Congestion    HPI Sandra Petersen is a 71 y.o. female.   Patient presents with nasal congestion, rhinorrhea, sinus pain and pressure, sore throat, nonproductive cough and generalized headaches for 5 days.  Decreased appetite but tolerating some food and liquids.  Has not eaten today.  Has noticed blood in the mucus of the left nostril.  Cough is causing worsening sore throat and headache.  No known sick contacts.  Has attempted use of Mucinex cold and flu which has been minimally effective.  History of diabetes mellitus, last A1c 15.6.  Denies fever, chills, body aches, shortness of breath or wheezing.  Past Medical History:  Diagnosis Date   Chronic headaches    Diabetes mellitus without complication [facility])     Patient Active Problem List   Diagnosis Date Noted   Noncompliance 11/11/2021   Viral illness 10/27/2018   Hand pain 06/13/2018   Groin pain, left 02/18/2016   Gastroenteritis 06/18/2015   B12 deficiency 12/23/2014   Paresthesia 12/18/2014   Lateral epicondylitis (tennis elbow) 01/09/2014   Bursitis of shoulder, right 09/12/2013   Well adult exam 09/05/2013   Biceps tendonitis on right 09/05/2013   Essential hypertension 09/05/2013   DM type 2, not at goal [facility]) 06/21/2012   Vitamin D deficiency 06/21/2012    Past Surgical History:  Procedure Laterality Date   ABDOMINAL HYSTERECTOMY  1982   partial    OB History   No obstetric history on file.      Home Medications    Prior to Admission medications   Medication Sig Start Date End Date Taking? Authorizing Provider  acetaminophen (TYLENOL) 325 MG tablet Take 650 mg by mouth every 6 (six) hours as needed (pain).   Yes [provider]  b complex vitamins tablet Take 1 tablet by mouth daily. 06/14/18   Plotnikov, Evie Lacks, MD   Cholecalciferol (VITAMIN D3) 1.25 MG (50000 UT) CAPS Take 1 capsule by mouth once a week. 11/16/21   Plotnikov, Evie Lacks, MD  Cholecalciferol (VITAMIN D3) 50 MCG (2000 UT) capsule Take 1 capsule (2,000 Units total) by mouth daily. 06/14/18   Plotnikov, Evie Lacks, MD  losartan (COZAAR) 100 MG tablet Take 1 tablet (100 mg total) by mouth daily. 11/11/21 11/11/22  Plotnikov, Evie Lacks, MD  repaglinide (PRANDIN) 1 MG tablet Take 1 tablet (1 mg total) by mouth 3 (three) times daily before meals. 11/11/21   Plotnikov, Evie Lacks, MD    Family History Family History  Problem Relation Age of Onset   Diabetes Sister    Hypertension Other        Parent   Heart disease Mother    Kidney disease Mother     Social History Social History   Tobacco Use   Smoking status: Never   Smokeless tobacco: Never  Substance Use Topics   Alcohol use: No   Drug use: No     Allergies   Metformin and related   Review of Systems Review of Systems  Constitutional: Negative.   HENT:  Positive for congestion, rhinorrhea, sinus pressure, sinus pain and sore throat. Negative for dental problem, drooling, ear discharge, ear pain, facial swelling, hearing loss, mouth sores, nosebleeds, postnasal drip, sneezing, tinnitus, trouble swallowing and voice change.   Respiratory:  Positive for cough. Negative  for apnea, choking, chest tightness, shortness of breath, wheezing and stridor.   Neurological:  Positive for headaches. Negative for dizziness, tremors, seizures, syncope, facial asymmetry, speech difficulty, weakness, light-headedness and numbness.     Physical Exam Triage Vital Signs ED Triage Vitals  Enc Vitals Group     BP 03/24/22 1405 121/75     Pulse Rate 03/24/22 1405 (!) 112     Resp 03/24/22 1405 16     Temp 03/24/22 1405 99.3 F (37.4 C)     Temp Source 03/24/22 1405 Oral     SpO2 03/24/22 1405 98 %     Weight --      Height --      Head Circumference --      Peak Flow --      Pain Score 03/24/22  1404 10     Pain Loc --      Pain Edu? --      Excl. in [place]? --    No data found.  Updated Vital Signs BP 121/75 (BP Location: Left Arm)   Pulse (!) 112   Temp 99.3 F (37.4 C) (Oral)   Resp 16   SpO2 98%   Visual Acuity Right Eye Distance:   Left Eye Distance:   Bilateral Distance:    Right Eye Near:   Left Eye Near:    Bilateral Near:     Physical Exam Constitutional:      Appearance: Normal appearance.  HENT:     Head: Normocephalic.     Right Ear: Tympanic membrane, ear canal and external ear normal.     Left Ear: Ear canal and external ear normal.     Nose: Congestion and rhinorrhea present.     Mouth/Throat:     Mouth: Mucous membranes are moist.     Pharynx: Oropharynx is clear. No posterior oropharyngeal erythema.  Eyes:     Extraocular Movements: Extraocular movements intact.  Cardiovascular:     Rate and Rhythm: Normal rate and regular rhythm.     Pulses: Normal pulses.     Heart sounds: Normal heart sounds.  Pulmonary:     Effort: Pulmonary effort is normal.     Breath sounds: Normal breath sounds.  Skin:    General: Skin is warm and dry.  Neurological:     Mental Status: She is alert and oriented to person, place, and time. Mental status is at baseline.  Psychiatric:        Mood and Affect: Mood normal.        Behavior: Behavior normal.      UC Treatments / Results  Labs (all labs ordered are listed, but only abnormal results are displayed) Labs Reviewed - No data to display  EKG   Radiology No results found.  Procedures Procedures (including critical care time)  Medications Ordered in UC Medications - No data to display  Initial Impression / Assessment and Plan / UC Course  I have reviewed the triage vital signs and the nursing notes.  Pertinent labs & imaging results that were available during my care of the patient were reviewed by me and considered in my medical decision making (see chart for details).  Acute Upper  respiratory infection  Patient is in no signs of distress nor toxic appearing.  Vital signs are stable.  Low suspicion for pneumonia, pneumothorax or bronchitis and therefore will defer imaging.  The harshness of cause given methylprednisone in the office, will avoid oral steroids due to uncontrolled diabetes.  Prescribed azithromycin, Tessalon and Promethazine DM outpatient.May use additional over-the-counter medications as needed for supportive care.  May follow-up with urgent care as needed if symptoms persist or worsen.   Final Clinical Impressions(s) / UC Diagnoses   Final diagnoses:  None   Discharge Instructions   None    ED Prescriptions   None    PDMP not reviewed this encounter.   Valinda Hoar, NP 03/24/22 1433

## 2022-03-24 NOTE — ED Triage Notes (Signed)
sinus pressure cough 5 days, also having headaches. States when blowing her nose there is some blood. No history of seasonal allergies, bronchitis, or asthma.   No known sick exposure.   Patient has not history of dizziness or balance problems. Noticed that Patient swayed as she walked having to hold on to the walls.

## 2022-03-24 NOTE — ED Notes (Signed)
Discharged by Orlin Hilding, RN.

## 2022-03-24 NOTE — Discharge Instructions (Signed)
Begin azithromycin as directed to provide coverage for bacteria  You have been given a steroid injection here in the office to help calm the harshness of your coughing  You may use Tessalon pill every 8 hours to help calm your coughing  You may use cough syrup every 6 hours to help calm your coughing, be mindful this medication will make you drowsy    You can take Tylenol and/or Ibuprofen as needed for fever reduction and pain relief.   For cough: honey 1/2 to 1 teaspoon (you can dilute the honey in water or another fluid).  You can also use guaifenesin and dextromethorphan for cough. You can use a humidifier for chest congestion and cough.  If you don't have a humidifier, you can sit in the bathroom with the hot shower running.      For sore throat: try warm salt water gargles, cepacol lozenges, throat spray, warm tea or water with lemon/honey, popsicles or ice, or OTC cold relief medicine for throat discomfort.   For congestion: take a daily anti-histamine like Zyrtec, Claritin, and a oral decongestant, such as pseudoephedrine.  You can also use Flonase 1-2 sprays in each nostril daily.   It is important to stay hydrated: drink plenty of fluids (water, gatorade/powerade/pedialyte, juices, or teas) to keep your throat moisturized and help further relieve irritation/discomfort.

## 2022-05-07 DIAGNOSIS — Z8249 Family history of ischemic heart disease and other diseases of the circulatory system: Secondary | ICD-10-CM | POA: Diagnosis not present

## 2022-05-07 DIAGNOSIS — I10 Essential (primary) hypertension: Secondary | ICD-10-CM | POA: Diagnosis not present

## 2022-05-07 DIAGNOSIS — E119 Type 2 diabetes mellitus without complications: Secondary | ICD-10-CM | POA: Diagnosis not present

## 2022-05-07 DIAGNOSIS — Z833 Family history of diabetes mellitus: Secondary | ICD-10-CM | POA: Diagnosis not present

## 2022-05-07 DIAGNOSIS — Z7984 Long term (current) use of oral hypoglycemic drugs: Secondary | ICD-10-CM | POA: Diagnosis not present

## 2022-05-26 ENCOUNTER — Emergency Department (HOSPITAL_COMMUNITY)
Admission: EM | Admit: 2022-05-26 | Discharge: 2022-05-27 | Disposition: A | Discharge status: Home / Self Care | Payer: Medicare HMO | Attending: Emergency Medicine | Admitting: Emergency Medicine

## 2022-05-26 ENCOUNTER — Other Ambulatory Visit: Payer: Self-pay

## 2022-05-26 DIAGNOSIS — E876 Hypokalemia: Secondary | ICD-10-CM | POA: Insufficient documentation

## 2022-05-26 DIAGNOSIS — E871 Hypo-osmolality and hyponatremia: Secondary | ICD-10-CM | POA: Diagnosis not present

## 2022-05-26 DIAGNOSIS — Z79899 Other long term (current) drug therapy: Secondary | ICD-10-CM | POA: Diagnosis not present

## 2022-05-26 DIAGNOSIS — U071 COVID-19: Secondary | ICD-10-CM | POA: Diagnosis not present

## 2022-05-26 DIAGNOSIS — I10 Essential (primary) hypertension: Secondary | ICD-10-CM | POA: Diagnosis not present

## 2022-05-26 DIAGNOSIS — R519 Headache, unspecified: Secondary | ICD-10-CM | POA: Diagnosis not present

## 2022-05-26 DIAGNOSIS — E119 Type 2 diabetes mellitus without complications: Secondary | ICD-10-CM | POA: Diagnosis not present

## 2022-05-26 LAB — CBC WITH DIFFERENTIAL/PLATELET
Abs Immature Granulocytes: 0.02 10*3/uL (ref 0.00–0.07)
Basophils Absolute: 0 10*3/uL (ref 0.0–0.1)
Basophils Relative: 0 %
Eosinophils Absolute: 0 10*3/uL (ref 0.0–0.5)
Eosinophils Relative: 0 %
HCT: 39.4 % (ref 36.0–46.0)
Hemoglobin: 12.4 g/dL (ref 12.0–15.0)
Immature Granulocytes: 0 %
Lymphocytes Relative: 19 %
Lymphs Abs: 1.7 10*3/uL (ref 0.7–4.0)
MCH: 28.8 pg (ref 26.0–34.0)
MCHC: 31.5 g/dL (ref 30.0–36.0)
MCV: 91.4 fL (ref 80.0–100.0)
Monocytes Absolute: 1.2 10*3/uL — ABNORMAL HIGH (ref 0.1–1.0)
Monocytes Relative: 14 %
Neutro Abs: 5.9 10*3/uL (ref 1.7–7.7)
Neutrophils Relative %: 67 %
Platelets: 228 10*3/uL (ref 150–400)
RBC: 4.31 MIL/uL (ref 3.87–5.11)
RDW: 12.8 % (ref 11.5–15.5)
WBC: 8.9 10*3/uL (ref 4.0–10.5)
nRBC: 0 % (ref 0.0–0.2)

## 2022-05-26 LAB — COMPREHENSIVE METABOLIC PANEL
ALT: 23 U/L (ref 0–44)
AST: 23 U/L (ref 15–41)
Albumin: 4.2 g/dL (ref 3.5–5.0)
Alkaline Phosphatase: 92 U/L (ref 38–126)
Anion gap: 10 (ref 5–15)
BUN: 10 mg/dL (ref 8–23)
CO2: 27 mmol/L (ref 22–32)
Calcium: 9 mg/dL (ref 8.9–10.3)
Chloride: 95 mmol/L — ABNORMAL LOW (ref 98–111)
Creatinine, Ser: 0.62 mg/dL (ref 0.44–1.00)
GFR, Estimated: >60 mL/min (ref >60)
Glucose, Bld: 245 mg/dL — ABNORMAL HIGH (ref 70–99)
Potassium: 3 mmol/L — ABNORMAL LOW (ref 3.5–5.1)
Sodium: 132 mmol/L — ABNORMAL LOW (ref 135–145)
Total Bilirubin: 0.8 mg/dL (ref 0.3–1.2)
Total Protein: 8.6 g/dL — ABNORMAL HIGH (ref 6.5–8.1)

## 2022-05-26 LAB — RESP PANEL BY RT-PCR (RSV, FLU A&B, COVID)  RVPGX2
Influenza A by PCR: NEGATIVE
Influenza B by PCR: NEGATIVE
Resp Syncytial Virus by PCR: NEGATIVE
SARS Coronavirus 2 by RT PCR: POSITIVE — AB

## 2022-05-26 NOTE — ED Provider Triage Note (Signed)
Emergency Medicine Provider Triage Evaluation Note  Sandra Petersen , a 72 y.o. female  was evaluated in triage.  Pt complains of headaches.  Patient reports that he has had headaches for the past week off and on without significant improvement while taking Tylenol.  Patient does report that she is also been having some runny nose, epistaxis and congestion.  Patient denies a chronic history of epistaxis.  Not on any blood thinners.  Review of Systems  Positive: As above Negative: As above  Physical Exam  BP (!) 145/79 (BP Location: Left Arm)   Pulse (!) 108   Temp 100.2 F (37.9 C) (Oral)   Resp 18   Ht 5\' 2"  (1.575 m)   Wt 55 kg   SpO2 98%   BMI 22.18 kg/m  Gen:   Awake, no distress   Resp:  Normal effort clear to auscultation bilaterally MSK:   Moves extremities without difficulty  Other:    Medical Decision Making  Medically screening exam initiated at 5:24 PM.  Appropriate orders placed.  Sandra Petersen was informed that the remainder of the evaluation will be completed by another provider, this initial triage assessment does not replace that evaluation, and the importance of remaining in the ED until their evaluation is complete.     Luvenia Heller, PA-C 05/26/22 1725

## 2022-05-26 NOTE — ED Triage Notes (Signed)
Pt reports headache x week, nauseous in triage. Nose bleed episode last week.

## 2022-05-27 MED ORDER — ACETAMINOPHEN 500 MG PO TABS
1000.0000 mg | ORAL_TABLET | Freq: Once | ORAL | Status: AC
  Administered 2022-05-27: 1000 mg via ORAL
  Filled 2022-05-27: qty 2

## 2022-05-27 NOTE — Discharge Instructions (Signed)
You may take over-the-counter medicine for symptomatic relief, such as Tylenol, Motrin, black elderberry, and/or Coricidin HBP (this will not affect your blood pressure).  Medicines such as TheraFlu, Alka seltzer cold and flu will affect your blood pressure.  Please limit acetaminophen (Tylenol) to 4000 mg and Ibuprofen (Motrin, Advil, etc.) to 2400 mg for a 24hr period. Please note that other over-the-counter medicine may contain acetaminophen or ibuprofen as a component of their ingredients.

## 2022-05-27 NOTE — ED Provider Notes (Signed)
[facility] DEPT Provider Note  CSN: [id number] Arrival date & time: 05/26/22 1636  Chief Complaint(s) Headache  HPI Sandra Petersen is a 72 y.o. female with a past medical history listed below including hypertension, diabetes, chronic headache who presents to the emergency department with 1 week of persistent migrating headaches.  Pain at times frontal and then at times occipital.  Associated nausea without emesis.  She reports 1 day of URI symptoms.  She denies any known fevers or chills.  She does report generalized fatigue over the past several days.  No chest pain or shortness of breath.  No coughing or congestion.  She did report having nosebleeds a couple weeks ago.  No other physical complaints.  The history is provided by the patient.    Past Medical History Past Medical History:  Diagnosis Date   Chronic headaches    Diabetes mellitus without complication [facility])    Patient Active Problem List   Diagnosis Date Noted   Noncompliance 11/11/2021   Viral illness 10/27/2018   Hand pain 06/13/2018   Groin pain, left 02/18/2016   Gastroenteritis 06/18/2015   B12 deficiency 12/23/2014   Paresthesia 12/18/2014   Lateral epicondylitis (tennis elbow) 01/09/2014   Bursitis of shoulder, right 09/12/2013   Well adult exam 09/05/2013   Biceps tendonitis on right 09/05/2013   Essential hypertension 09/05/2013   DM type 2, not at goal [facility]) 06/21/2012   Vitamin D deficiency 06/21/2012   Home Medication(s) Prior to Admission medications   Medication Sig Start Date End Date Taking? Authorizing Provider  acetaminophen (TYLENOL) 325 MG tablet Take 650 mg by mouth every 6 (six) hours as needed (pain).    [provider]  azithromycin (ZITHROMAX) 250 MG tablet Take 1 tablet (250 mg total) by mouth daily. Take first 2 tablets together, then 1 every day until finished. 03/24/22   Hans Eden, NP  b complex vitamins tablet Take 1 tablet by mouth  daily. 06/14/18   Plotnikov, Evie Lacks, MD  benzonatate (TESSALON) 100 MG capsule Take 1 capsule (100 mg total) by mouth every 8 (eight) hours. 03/24/22   White, Leitha Schuller, NP  Cholecalciferol (VITAMIN D3) 1.25 MG (50000 UT) CAPS Take 1 capsule by mouth once a week. 11/16/21   Plotnikov, Evie Lacks, MD  Cholecalciferol (VITAMIN D3) 50 MCG (2000 UT) capsule Take 1 capsule (2,000 Units total) by mouth daily. 06/14/18   Plotnikov, Evie Lacks, MD  losartan (COZAAR) 100 MG tablet Take 1 tablet (100 mg total) by mouth daily. 11/11/21 11/11/22  Plotnikov, Evie Lacks, MD  promethazine-dextromethorphan (PROMETHAZINE-DM) 6.25-15 MG/5ML syrup Take 5 mLs by mouth 4 (four) times daily as needed for cough. 03/24/22   White, Leitha Schuller, NP  repaglinide (PRANDIN) 1 MG tablet Take 1 tablet (1 mg total) by mouth 3 (three) times daily before meals. 11/11/21   Plotnikov, Evie Lacks, MD  Allergies Metformin and related  Review of Systems Review of Systems As noted in HPI  Physical Exam Vital Signs  I have reviewed the triage vital signs BP 128/77   Pulse 81   Temp 98.2 F (36.8 C)   Resp 17   Ht 5\' 2"  (1.575 m)   Wt 55 kg   SpO2 98%   BMI 22.18 kg/m   Physical Exam Vitals reviewed.  Constitutional:      General: She is not in acute distress.    Appearance: She is well-developed. She is not diaphoretic.  HENT:     Head: Normocephalic and atraumatic.     Right Ear: Tympanic membrane normal.     Left Ear: Tympanic membrane normal.     Nose: Congestion present.     Mouth/Throat:     Pharynx: No pharyngeal swelling or posterior oropharyngeal erythema.     Tonsils: No tonsillar exudate or tonsillar abscesses.  Eyes:     General: No scleral icterus.       Right eye: No discharge.        Left eye: No discharge.     Conjunctiva/sclera: Conjunctivae normal.     Pupils: Pupils are equal,  round, and reactive to light.  Cardiovascular:     Rate and Rhythm: Normal rate and regular rhythm.     Heart sounds: No murmur heard.    No friction rub. No gallop.  Pulmonary:     Effort: Pulmonary effort is normal. No respiratory distress.     Breath sounds: Normal breath sounds. No stridor. No rales.  Abdominal:     General: There is no distension.     Palpations: Abdomen is soft.     Tenderness: There is no abdominal tenderness.  Musculoskeletal:        General: No tenderness.     Cervical back: Normal range of motion and neck supple.  Skin:    General: Skin is warm and dry.     Findings: No erythema or rash.  Neurological:     Mental Status: She is alert and oriented to person, place, and time.     ED Results and Treatments Labs (all labs ordered are listed, but only abnormal results are displayed) Labs Reviewed  RESP PANEL BY RT-PCR (RSV, FLU A&B, COVID)  RVPGX2 - Abnormal; Notable for the following components:      Result Value   SARS Coronavirus 2 by RT PCR POSITIVE (*)    All other components within normal limits  COMPREHENSIVE METABOLIC PANEL - Abnormal; Notable for the following components:   Sodium 132 (*)    Potassium 3.0 (*)    Chloride 95 (*)    Glucose, Bld 245 (*)    Total Protein 8.6 (*)    All other components within normal limits  CBC WITH DIFFERENTIAL/PLATELET - Abnormal; Notable for the following components:   Monocytes Absolute 1.2 (*)    All other components within normal limits  EKG  EKG Interpretation  Date/Time:    Ventricular Rate:    PR Interval:    QRS Duration:   QT Interval:    QTC Calculation:   R Axis:     Text Interpretation:         Radiology No results found.  Medications Ordered in ED Medications  acetaminophen (TYLENOL) tablet 1,000 mg (1,000 mg Oral Given 05/27/22 0356)                                                                                                                                      Procedures Procedures  (including critical care time)  Medical Decision Making / ED Course   Medical Decision Making Amount and/or Complexity of Data Reviewed Labs: ordered. Decision-making details documented in ED Course.  Risk OTC drugs.    Noted to have low grade temp in triage with URI sx.  Patient presents with viral symptoms for several days. adequate oral hydration. Rest of history as above.  Patient appears well. No signs of toxicity, patient is interactive. No hypoxia, tachypnea or other signs of respiratory distress. No sign of clinical dehydration. Lung exam clear. Rest of exam as above.  CBC without leukocytosis or anemia Metabolic panel with mild hyponatremia, hypokalemia.  There is hyperglycemia without evidence of DKA.  No renal sufficiency. COVID test was positive -which is likely the source of her symptoms.  No evidence suggestive of pharyngitis, AOM, PNA, or meningitis. Doubt SAH, or ICH.  Chest x-ray not indicated at this time.  Discussed symptomatic treatment with the patient and they will follow closely with their PCP.        Final Clinical Impression(s) / ED Diagnoses Final diagnoses:  COVID-19 virus infection   The patient appears reasonably screened and/or stabilized for discharge and I doubt any other medical condition or other [facility] requiring further screening, evaluation, or treatment in the ED at this time. I have discussed the findings, Dx and Tx plan with the patient/family who expressed understanding and agree(s) with the plan. Discharge instructions discussed at length. The patient/family was given strict return precautions who verbalized understanding of the instructions. No further questions at time of discharge.  Disposition: Discharge  Condition: Good  ED Discharge Orders     None        Follow Up: Plotnikov, Georgina Quint,  MD [address] [place] [address] [phone]  Call  as needed           This chart was dictated using voice recognition software.  Despite best efforts to proofread,  errors can occur which can change the documentation meaning.    Nira Conn, MD 05/27/22 [phone]

## 2022-06-24 ENCOUNTER — Telehealth: Payer: Self-pay | Admitting: Internal Medicine

## 2022-06-24 NOTE — Telephone Encounter (Signed)
Left message for patient to call back and schedule Medicare Annual Wellness Visit (AWV).  Please offer to do virtually or by telephone.  Last AWV 12/04/2019   Please schedule at any time with [facility] schedule 2 Courtney  30 minute appointment   Any questions, please contact me at [phone]     Thank you,   [facility] [place] [facility] ??[phone] or ??[phone]

## 2022-08-05 ENCOUNTER — Emergency Department (HOSPITAL_BASED_OUTPATIENT_CLINIC_OR_DEPARTMENT_OTHER): Payer: Medicare HMO | Admitting: Radiology

## 2022-08-05 ENCOUNTER — Emergency Department (HOSPITAL_BASED_OUTPATIENT_CLINIC_OR_DEPARTMENT_OTHER)
Admission: EM | Admit: 2022-08-05 | Discharge: 2022-08-05 | Disposition: A | Discharge status: Home / Self Care | Payer: Medicare HMO | Attending: Emergency Medicine | Admitting: Emergency Medicine

## 2022-08-05 ENCOUNTER — Encounter (HOSPITAL_BASED_OUTPATIENT_CLINIC_OR_DEPARTMENT_OTHER): Payer: Self-pay

## 2022-08-05 ENCOUNTER — Other Ambulatory Visit: Payer: Self-pay

## 2022-08-05 DIAGNOSIS — M5136 Other intervertebral disc degeneration, lumbar region: Secondary | ICD-10-CM

## 2022-08-05 DIAGNOSIS — I10 Essential (primary) hypertension: Secondary | ICD-10-CM | POA: Insufficient documentation

## 2022-08-05 DIAGNOSIS — M5432 Sciatica, left side: Secondary | ICD-10-CM

## 2022-08-05 DIAGNOSIS — M5186 Other intervertebral disc disorders, lumbar region: Secondary | ICD-10-CM | POA: Insufficient documentation

## 2022-08-05 DIAGNOSIS — M5442 Lumbago with sciatica, left side: Secondary | ICD-10-CM | POA: Insufficient documentation

## 2022-08-05 DIAGNOSIS — Z79899 Other long term (current) drug therapy: Secondary | ICD-10-CM | POA: Diagnosis not present

## 2022-08-05 DIAGNOSIS — M25552 Pain in left hip: Secondary | ICD-10-CM | POA: Diagnosis not present

## 2022-08-05 DIAGNOSIS — M545 Low back pain, unspecified: Secondary | ICD-10-CM | POA: Diagnosis not present

## 2022-08-05 MED ORDER — OXYCODONE-ACETAMINOPHEN 5-325 MG PO TABS
2.0000 | ORAL_TABLET | Freq: Once | ORAL | Status: AC
  Administered 2022-08-05: 2 via ORAL
  Filled 2022-08-05: qty 2

## 2022-08-05 MED ORDER — OXYCODONE HCL 5 MG PO TABS: 5.0000 mg | ORAL_TABLET | Freq: Four times a day (QID) | ORAL | 0 refills | Status: AC | PRN

## 2022-08-05 MED ORDER — IBUPROFEN 600 MG PO TABS: 600.0000 mg | ORAL_TABLET | Freq: Three times a day (TID) | ORAL | 0 refills | Status: AC | PRN

## 2022-08-05 NOTE — ED Provider Notes (Signed)
[facility] Provider Note   CSN: [id number] Arrival date & time: 08/05/22  1012     History  Chief Complaint  Patient presents with   Hip Pain    Sandra Petersen is a 72 y.o. female with past medical history of prediabetes who presents to the ED complaining of left hip pain for the past 4 days.  Patient denies any fall or injury to the hip.  She reports that she has pain to the left lower back and left hip that radiates down the left leg.  She has increased pain with weightbearing and ambulation but is able to walk.  She denies fever, chills, nausea, vomiting, abdominal pain, diarrhea, chest pain, shortness of breath, dysuria, hematuria, leg weakness, numbness, tingling or bowel/bladder dysfunction.  She reports that she has previously had sciatica on the right side but never had symptoms on the left.  She has been taking over-the-counter medication without relief of her symptoms.      Home Medications Prior to Admission medications   Medication Sig Start Date End Date Taking? Authorizing Provider  ibuprofen (ADVIL) 600 MG tablet Take 1 tablet (600 mg total) by mouth every 8 (eight) hours as needed for up to 3 days for mild pain or moderate pain. 08/05/22 08/08/22 Yes Amaurie Wandel L, PA-C  oxyCODONE (ROXICODONE) 5 MG immediate release tablet Take 1 tablet (5 mg total) by mouth every 6 (six) hours as needed for up to 5 days for severe pain or breakthrough pain. 08/05/22 08/10/22 Yes Josejuan Hoaglin L, PA-C  acetaminophen (TYLENOL) 325 MG tablet Take 650 mg by mouth every 6 (six) hours as needed (pain).    [provider]  azithromycin (ZITHROMAX) 250 MG tablet Take 1 tablet (250 mg total) by mouth daily. Take first 2 tablets together, then 1 every day until finished. 03/24/22   Hans Eden, NP  b complex vitamins tablet Take 1 tablet by mouth daily. 06/14/18   Plotnikov, Evie Lacks, MD  benzonatate (TESSALON) 100 MG capsule Take 1  capsule (100 mg total) by mouth every 8 (eight) hours. 03/24/22   White, Leitha Schuller, NP  Cholecalciferol (VITAMIN D3) 1.25 MG (50000 UT) CAPS Take 1 capsule by mouth once a week. 11/16/21   Plotnikov, Evie Lacks, MD  Cholecalciferol (VITAMIN D3) 50 MCG (2000 UT) capsule Take 1 capsule (2,000 Units total) by mouth daily. 06/14/18   Plotnikov, Evie Lacks, MD  losartan (COZAAR) 100 MG tablet Take 1 tablet (100 mg total) by mouth daily. 11/11/21 11/11/22  Plotnikov, Evie Lacks, MD  promethazine-dextromethorphan (PROMETHAZINE-DM) 6.25-15 MG/5ML syrup Take 5 mLs by mouth 4 (four) times daily as needed for cough. 03/24/22   White, Leitha Schuller, NP  repaglinide (PRANDIN) 1 MG tablet Take 1 tablet (1 mg total) by mouth 3 (three) times daily before meals. 11/11/21   Plotnikov, Evie Lacks, MD      Allergies    Metformin and related    Review of Systems   Review of Systems  All other systems reviewed and are negative.   Physical Exam Updated Vital Signs BP (!) 177/78 (BP Location: Left Arm)   Pulse 87   Temp 98.3 F (36.8 C) (Oral)   Resp 18   Ht 5\' 2"  (1.575 m)   Wt 49.9 kg   SpO2 100%   BMI 20.12 kg/m  Physical Exam Vitals and nursing note reviewed.  Constitutional:      General: She is not in acute distress.  Appearance: Normal appearance. She is not ill-appearing, toxic-appearing or diaphoretic.  HENT:     Head: Normocephalic and atraumatic.     Mouth/Throat:     Mouth: Mucous membranes are moist.  Eyes:     General: No scleral icterus.    Conjunctiva/sclera: Conjunctivae normal.  Cardiovascular:     Rate and Rhythm: Normal rate and regular rhythm.     Heart sounds: No murmur heard. Pulmonary:     Effort: Pulmonary effort is normal. No respiratory distress.     Breath sounds: Normal breath sounds. No stridor. No wheezing, rhonchi or rales.  Chest:     Chest wall: No tenderness.  Abdominal:     General: Abdomen is flat. There is no distension.     Palpations: Abdomen is soft.      Tenderness: There is no abdominal tenderness. There is no right CVA tenderness, left CVA tenderness, guarding or rebound.  Musculoskeletal:     Cervical back: Normal range of motion and neck supple. No rigidity or tenderness.     Right lower leg: No edema.     Left lower leg: No edema.     Comments: No midline spinal tenderness, stepoffs, or deformities; Left paraspinous muscle tenderness to lumbar region of spine, no overt tenderness over left hip or left lower extremity, no deformity, 5/5 strength to bilateral LE, normal sensation, 2+ DP and PT pulses  Skin:    General: Skin is warm and dry.     Capillary Refill: Capillary refill takes less than 2 seconds.  Neurological:     General: No focal deficit present.     Mental Status: She is alert and oriented to person, place, and time.  Psychiatric:        Behavior: Behavior normal.     ED Results / Procedures / Treatments   Labs (all labs ordered are listed, but only abnormal results are displayed) Labs Reviewed - No data to display  EKG None  Radiology DG Hip Unilat W or Wo Pelvis 2-3 Views Left  Result Date: 08/05/2022 CLINICAL DATA:  Back pain, left hip pain EXAM: DG HIP (WITH OR WITHOUT PELVIS) 2-3V LEFT COMPARISON:  Left hip radiographs done on 02/18/2016 FINDINGS: No recent fracture or dislocation is seen. No focal lytic lesions are seen. There is interval appearance of small bony spurs in left hip. There is no significant joint space narrowing. IMPRESSION: No fracture or dislocation is seen. There is interval appearance of degenerative changes in left hip with small bony spurs. Electronically Signed   By: Elmer Picker M.D.   On: 08/05/2022 11:58   DG Lumbar Spine 2-3 Views  Result Date: 08/05/2022 CLINICAL DATA:  Back pain, left hip pain EXAM: LUMBAR SPINE - 2-3 VIEW COMPARISON:  None Available. FINDINGS: No recent fracture is seen. Alignment of posterior margins of portable bodies appears normal. There is no significant  disc space narrowing. IMPRESSION: No acute findings are seen in lumbar spine. Electronically Signed   By: Elmer Picker M.D.   On: 08/05/2022 11:56    Procedures Procedures    Medications Ordered in ED Medications  oxyCODONE-acetaminophen (PERCOCET/ROXICET) 5-325 MG per tablet 2 tablet (2 tablets Oral Given 08/05/22 1107)    ED Course/ Medical Decision Making/ A&P                             Medical Decision Making Amount and/or Complexity of Data Reviewed Radiology: ordered. Decision-making details documented in ED  Course.  Risk Prescription drug management.   Medical Decision Making:   CHERELLE RENNER is a 72 y.o. female who presented to the ED today with left hip pain detailed above.    Additional history discussed with patient's family/caregivers.  Patient's presentation is complicated by their history of sciatica, DM.  Complete initial physical exam performed, notably the patient  was inno acute distress. She had left paraspinous muscle tenderness over left lumbar spine but no midline spinal tenderness, stepoffs, or deformities. She had intact strength to bilateral lower extremities, intact sensation, intact pulses.  Normal heart and lung exam. Reviewed and confirmed nursing documentation for past medical history, family history, social history.    Initial Assessment:   With the patient's presentation of left hip pain, differential diagnosis includes but is not limited to fracture, muscle strain, cauda equina, spinal stenosis. DDD, ankylosing spondylitis, acute ligamentous injury, disk herniation, spondylolisthesis, Epidural compression syndrome, metastatic cancer, transverse myelitis, vertebral osteomyelitis, diskitis, kidney stone, pyelonephritis, AAA, Perforated ulcer, Retrocecal appendicitis, pancreatitis, bowel obstruction, retroperitoneal hemorrhage or mass, meningitis.  This is most consistent with an acute complicated illness  Initial Plan:  XR L hip and lumbar  spine Pain management Objective evaluation as reviewed   Initial Study Results:   Radiology:  All images reviewed independently. Agree with radiology report at this time.   DG Hip Unilat W or Wo Pelvis 2-3 Views Left  Result Date: 08/05/2022 CLINICAL DATA:  Back pain, left hip pain EXAM: DG HIP (WITH OR WITHOUT PELVIS) 2-3V LEFT COMPARISON:  Left hip radiographs done on 02/18/2016 FINDINGS: No recent fracture or dislocation is seen. No focal lytic lesions are seen. There is interval appearance of small bony spurs in left hip. There is no significant joint space narrowing. IMPRESSION: No fracture or dislocation is seen. There is interval appearance of degenerative changes in left hip with small bony spurs. Electronically Signed   By: Elmer Picker M.D.   On: 08/05/2022 11:58   DG Lumbar Spine 2-3 Views  Result Date: 08/05/2022 CLINICAL DATA:  Back pain, left hip pain EXAM: LUMBAR SPINE - 2-3 VIEW COMPARISON:  None Available. FINDINGS: No recent fracture is seen. Alignment of posterior margins of portable bodies appears normal. There is no significant disc space narrowing. IMPRESSION: No acute findings are seen in lumbar spine. Electronically Signed   By: Elmer Picker M.D.   On: 08/05/2022 11:56    Final Assessment and Plan:   This is a 72 year old female with a past medical history of type 2 diabetes who presents to the ED complaining of left hip pain for the last 4 days.  Patient denies any fall or injury to the hip.  She reports that pain is to the left lower back left hip and radiates down the left leg.  She is able to ambulate but does have increased pain with weightbearing.  She has a history of sciatica but on the right side.  No history of problems with her left hip.  She denies fever, chills, nausea, vomiting, abdominal pain, weight loss, bowel or bladder dysfunction, or other red flag symptoms or associated symptoms.  And, patient has intact motor function and sensation.  Strong  pulses distally.  No overt tenderness over the left hip.  No deformity to the left lower extremity.  She does have tenderness over the left paraspinous muscles of the lumbar spine.  Tenderness, step-offs, or deformities.  Patient is nontoxic-appearing.  Nonfocal neurologic exam. Abdomen soft and nontender. She is hypertensive but otherwise  vital signs are unremarkable.  Discussed case with attending physician who agreed with plan to proceed with imaging and pain management.  Imaging with degenerative changes but no other significant acute findings.  Suspect that patient is having flare of sciatica.  Unfortunately, with her diabetes unable to use steroids for symptomatic relief.  This, in discussion with the physician, will send patient home with a small amount of opioid pain medication to use for breakthrough/severe pain.  Patient aware that she should use other medications including ibuprofen and Tylenol for pain and only use opioids as absolutely necessary.  Recommended that she follow-up next week with her PCP to discuss any lingering symptoms and/or need for physical therapy. Pt expressed understanding of this plan. Strict ED return precautions given, all questions answered, and stable for discharge.    Clinical Impression:  1. Degenerative disc disease, lumbar   2. Sciatica of left side   3. Pain of left hip      Discharge           Final Clinical Impression(s) / ED Diagnoses Final diagnoses:  Degenerative disc disease, lumbar  Sciatica of left side  Pain of left hip    Rx / DC Orders ED Discharge Orders          Ordered    oxyCODONE (ROXICODONE) 5 MG immediate release tablet  Every 6 hours PRN        08/05/22 1211    ibuprofen (ADVIL) 600 MG tablet  Every 8 hours PRN        08/05/22 1211              Suzzette Righter, PA-C 08/05/22 1312    Pattricia Boss, MD 08/06/22 1719

## 2022-08-05 NOTE — Discharge Instructions (Signed)
Thank you for letting us take care of you today.  We see some degenerative changes on your x-rays but no fracture, dislocation, or other significant abnormality. We are sending you home with pain medication. Please take ibuprofen as prescribed over the next few days to help with your pain and discomfort. You may take Tylenol on top of this. You may take up to 1000mg  Tylenol every 6 hours. Do not take more than 4000mg  Tylenol in 24 hours.  For severe pain despite these medications, please use the oxycodone as prescribed. Please follow up with your PCP next week to discuss your visit today, any continued symptoms, or other concerns. They may want to do other testing or refer you to physical therapy to help with your pain.  For any new or worsening symptoms such as fever, chest pain, shortness of breath, weakness in the legs, severe back pain, or other concerns, please return to nearest emergency department for re-evaluation.

## 2022-08-05 NOTE — ED Triage Notes (Signed)
States has pain to left hip that radiates down left leg to foot.  Present in wheel chair   states has difficulty walking.  Onset Sunday

## 2022-08-05 NOTE — ED Notes (Signed)
Dc instructions reviewed with patient. Patient voiced understanding. Dc with belongings.  °

## 2022-08-09 ENCOUNTER — Telehealth: Payer: Self-pay

## 2022-08-09 NOTE — Telephone Encounter (Signed)
        Patient  visited [facility] on 3/28    Telephone encounter attempt :  1st  A HIPAA compliant voice message was left requesting a return call.  Instructed patient to call back.    [facility] [phone] [address], [place], [place] [address] Phone: [phone] Email: [email or web address]

## 2022-08-10 ENCOUNTER — Telehealth: Payer: Self-pay

## 2022-08-10 NOTE — Telephone Encounter (Signed)
        Patient  visited [facility] on 3/28    Telephone encounter attempt :  2nd  A HIPAA compliant voice message was left requesting a return call.  Instructed patient to call back .    [facility] [phone] [address], [place], [place] [address] Phone: [phone] Email: [email or web address]

## 2022-08-25 ENCOUNTER — Ambulatory Visit (INDEPENDENT_AMBULATORY_CARE_PROVIDER_SITE_OTHER): Payer: Medicare HMO | Admitting: Emergency Medicine

## 2022-08-25 ENCOUNTER — Encounter: Payer: Self-pay | Admitting: Emergency Medicine

## 2022-08-25 ENCOUNTER — Ambulatory Visit (INDEPENDENT_AMBULATORY_CARE_PROVIDER_SITE_OTHER): Payer: Medicare HMO

## 2022-08-25 VITALS — BP 126/84 | HR 91 | Temp 98.9°F | Ht 62.0 in | Wt 118.1 lb

## 2022-08-25 DIAGNOSIS — M5432 Sciatica, left side: Secondary | ICD-10-CM

## 2022-08-25 DIAGNOSIS — M5430 Sciatica, unspecified side: Secondary | ICD-10-CM | POA: Diagnosis not present

## 2022-08-25 MED ORDER — DICLOFENAC SODIUM 75 MG PO TBEC: 75.0000 mg | DELAYED_RELEASE_TABLET | Freq: Two times a day (BID) | ORAL | 0 refills | Status: AC

## 2022-08-25 NOTE — Progress Notes (Signed)
Sandra Petersen 72 y.o.   Chief Complaint  Patient presents with   Hip Pain    Pain in her left hip radiates down her leg    HISTORY OF PRESENT ILLNESS: Acute problem visit today.  Patient of Dr. Trinna Post Plotnikov. This is a 72 y.o. female complaining of left-sided lumbar sharp pain radiating down left buttock and posterior aspect of left leg that started without injuries about 1-1/2 weeks ago.  Denies bladder or bowel symptoms. No other complaints or medical concerns today.  Hip Pain      Prior to Admission medications   Medication Sig Start Date End Date Taking? Authorizing Provider  acetaminophen (TYLENOL) 325 MG tablet Take 650 mg by mouth every 6 (six) hours as needed (pain).   Yes [provider]  b complex vitamins tablet Take 1 tablet by mouth daily. 06/14/18  Yes Plotnikov, Georgina Quint, MD  benzonatate (TESSALON) 100 MG capsule Take 1 capsule (100 mg total) by mouth every 8 (eight) hours. 03/24/22  Yes White, Adrienne R, NP  Cholecalciferol (VITAMIN D3) 1.25 MG (50000 UT) CAPS Take 1 capsule by mouth once a week. 11/16/21  Yes Plotnikov, Georgina Quint, MD  Cholecalciferol (VITAMIN D3) 50 MCG (2000 UT) capsule Take 1 capsule (2,000 Units total) by mouth daily. 06/14/18  Yes Plotnikov, Georgina Quint, MD  diclofenac (VOLTAREN) 75 MG EC tablet Take 1 tablet (75 mg total) by mouth 2 (two) times daily for 14 days. 08/25/22 09/08/22 Yes Asta Corbridge, Eilleen Kempf, MD  losartan (COZAAR) 100 MG tablet Take 1 tablet (100 mg total) by mouth daily. 11/11/21 11/11/22 Yes Plotnikov, Georgina Quint, MD  promethazine-dextromethorphan (PROMETHAZINE-DM) 6.25-15 MG/5ML syrup Take 5 mLs by mouth 4 (four) times daily as needed for cough. 03/24/22  Yes White, Adrienne R, NP  repaglinide (PRANDIN) 1 MG tablet Take 1 tablet (1 mg total) by mouth 3 (three) times daily before meals. 11/11/21  Yes Plotnikov, Georgina Quint, MD  azithromycin (ZITHROMAX) 250 MG tablet Take 1 tablet (250 mg total) by mouth daily. Take first 2 tablets  together, then 1 every day until finished. 03/24/22   Valinda Hoar, NP    Allergies  Allergen Reactions   Metformin And Related     nausea    Patient Active Problem List   Diagnosis Date Noted   Noncompliance 11/11/2021   Viral illness 10/27/2018   Hand pain 06/13/2018   Groin pain, left 02/18/2016   Gastroenteritis 06/18/2015   B12 deficiency 12/23/2014   Paresthesia 12/18/2014   Lateral epicondylitis (tennis elbow) 01/09/2014   Bursitis of shoulder, right 09/12/2013   Well adult exam 09/05/2013   Biceps tendonitis on right 09/05/2013   Essential hypertension 09/05/2013   DM type 2, not at goal [facility]) 06/21/2012   Vitamin D deficiency 06/21/2012    Past Medical History:  Diagnosis Date   Chronic headaches    Diabetes mellitus without complication     Past Surgical History:  Procedure Laterality Date   ABDOMINAL HYSTERECTOMY  1982   partial    Social History   Socioeconomic History   Marital status: Legally Separated    Spouse name: Not on file   Number of children: Not on file   Years of education: 12   Highest education level: Not on file  Occupational History   Occupation: [employer]  Tobacco Use   Smoking status: Never   Smokeless tobacco: Never  Substance and Sexual Activity   Alcohol use: No   Drug use: No   Sexual activity:  Not Currently    Birth control/protection: Post-menopausal  Other Topics Concern   Not on file  Social History Narrative   Regular exercise-yes   Caffeine Use-yes   Social Determinants of Health   Financial Resource Strain: Low Risk  (12/04/2019)   Overall Financial Resource Strain (CARDIA)    Difficulty of Paying Living Expenses: Not hard at all  Food Insecurity: No Food Insecurity (12/04/2019)   Hunger Vital Sign    Worried About Running Out of Food in the Last Year: Never true    Ran Out of Food in the Last Year: Never true  Transportation Needs: No Transportation Needs (12/04/2019)   PRAPARE - Doctor, general practice (Medical): No    Lack of Transportation (Non-Medical): No  Physical Activity: Sufficiently Active (12/04/2019)   Exercise Vital Sign    Days of Exercise per Week: 5 days    Minutes of Exercise per Session: 30 min  Stress: No Stress Concern Present (12/04/2019)   [employer] of Occupational Health - Occupational Stress Questionnaire    Feeling of Stress : Not at all  Social Connections: Moderately Integrated (12/04/2019)   Social Connection and Isolation Panel [NHANES]    Frequency of Communication with Friends and Family: More than three times a week    Frequency of Social Gatherings with Friends and Family: More than three times a week    Attends Religious Services: More than 4 times per year    Active Member of [employer] or Organizations: Yes    Attends Banker Meetings: More than 4 times per year    Marital Status: Never married  Intimate Partner Violence: Not At Risk (12/04/2019)   Humiliation, Afraid, Rape, and Kick questionnaire    Fear of Current or Ex-Partner: No    Emotionally Abused: No    Physically Abused: No    Sexually Abused: No    Family History  Problem Relation Age of Onset   Diabetes Sister    Hypertension Other        Parent   Heart disease Mother    Kidney disease Mother      Review of Systems  Constitutional: Negative.  Negative for chills and fever.  HENT: Negative.  Negative for congestion and sore throat.   Respiratory: Negative.  Negative for cough and shortness of breath.   Cardiovascular: Negative.  Negative for chest pain and palpitations.  Gastrointestinal:  Negative for abdominal pain, diarrhea, nausea and vomiting.  Genitourinary: Negative.  Negative for dysuria and hematuria.  Skin: Negative.  Negative for rash.  Neurological: Negative.  Negative for dizziness and headaches.  All other systems reviewed and are negative.   Vitals:   08/25/22 0938  BP: 126/84  Pulse: 91  Temp: 98.9 F (37.2 C)  SpO2: 98%     Physical Exam Vitals reviewed.  Constitutional:      Appearance: Normal appearance.  HENT:     Head: Normocephalic.     Mouth/Throat:     Mouth: Mucous membranes are moist.     Pharynx: Oropharynx is clear.  Eyes:     Extraocular Movements: Extraocular movements intact.     Conjunctiva/sclera: Conjunctivae normal.     Pupils: Pupils are equal, round, and reactive to light.  Cardiovascular:     Rate and Rhythm: Normal rate and regular rhythm.     Pulses: Normal pulses.     Heart sounds: Normal heart sounds.  Pulmonary:     Effort: Pulmonary effort is normal.  Breath sounds: Normal breath sounds.  Abdominal:     Palpations: Abdomen is soft.     Tenderness: There is no abdominal tenderness.  Musculoskeletal:     Cervical back: No tenderness.     Lumbar back: No spasms, tenderness or bony tenderness. Decreased range of motion. Positive left straight leg raise test.  Lymphadenopathy:     Cervical: No cervical adenopathy.  Skin:    General: Skin is warm and dry.     Capillary Refill: Capillary refill takes less than 2 seconds.  Neurological:     General: No focal deficit present.     Mental Status: She is alert and oriented to person, place, and time.     Sensory: No sensory deficit.     Motor: No weakness.     Coordination: Coordination normal.     Gait: Gait normal.     Deep Tendon Reflexes: Reflexes normal.  Psychiatric:        Mood and Affect: Mood normal.        Behavior: Behavior normal.    DG Lumbar Spine 2-3 Views  Result Date: 08/25/2022 CLINICAL DATA:  Left-sided sciatica. Left lower extremity radiculopathy for over one-week. EXAM: LUMBAR SPINE - 2-3 VIEW COMPARISON:  Lumbar spine radiographs 08/05/2022 FINDINGS: There are 5 non-rib-bearing lumbar-type vertebral bodies. Normal frontal and sagittal alignment. Minimal posterior L5-S1 disc space narrowing is unchanged from prior. Mild-to-moderate L5-S1 facet joint sclerosis degenerative change. No acute  fracture is seen. Mild bilateral sacroiliac subchondral sclerosis. IMPRESSION: Very mild L5-S1 degenerative disc and facet disease, unchanged from prior. Electronically Signed   By: Neita Garnet M.D.   On: 08/25/2022 10:17     ASSESSMENT & PLAN: A total of 34 minutes was spent with the patient and counseling/coordination of care regarding preparing for this visit, review of most recent office visit notes, review of chronic medical conditions under management, review of all medications, review of x-ray report done today, diagnosis of sciatica and treatment, pain management, prognosis, documentation and need for follow-up.  Problem List Items Addressed This Visit       Nervous and Auditory   Sciatica, left side - Primary    Active and affecting quality of life No injuries.  No red flag signs or symptoms Benign physical examination Mechanical in nature Differential diagnosis discussed X-ray done today.  Report reviewed. Pain management discussed. Recommend diclofenac 75 mg twice a day for the next week Ortho referral if no better      Relevant Medications   diclofenac (VOLTAREN) 75 MG EC tablet   Other Relevant Orders   DG Lumbar Spine 2-3 Views   Patient Instructions  Sciatica  Sciatica is pain, weakness, tingling, or loss of feeling (numbness) along the sciatic nerve. The sciatic nerve starts in the lower back and goes down the back of each leg. Sciatica usually affects one side of the body. Sciatica usually goes away on its own or with treatment. Sometimes, sciatica may come back. What are the causes? This condition happens when the sciatic nerve is pinched or has pressure put on it. This may be caused by: A disk in between the bones of the spine bulging out too far (herniated disk). Changes in the spinal disks due to aging. A condition that affects a muscle in the butt. Extra bone growth near the sciatic nerve. A break (fracture) of the area between your hip bones  (pelvis). Pregnancy. Tumor. This is rare. What increases the risk? You are more likely to develop this condition  if you: Play sports that put pressure or stress on the spine. Have poor strength and ease of movement (flexibility). Have had a back injury or back surgery. Sit for long periods of time. Do activities that involve bending or lifting over and over again. Are very overweight (obese). What are the signs or symptoms? Symptoms can vary from mild to very bad. They may include: Any of these problems in the lower back, leg, hip, or butt: Mild tingling, loss of feeling, or dull aches. A burning feeling. Sharp pains. Loss of feeling in the back of the calf or the sole of the foot. Leg weakness. Very bad back pain that makes it hard to move. These symptoms may get worse when you cough, sneeze, or laugh. They may also get worse when you sit or stand for long periods of time. How is this treated? This condition often gets better without any treatment. However, treatment may include: Changing or cutting back on physical activity when you have pain. Exercising, including strengthening and stretching. Putting ice or heat on the affected area. Shots of medicines to relieve pain and swelling or to relax your muscles. Surgery. Follow these instructions at home: Medicines Take over-the-counter and prescription medicines only as told by your doctor. Ask your doctor if you should avoid driving or using machines while you are taking your medicine. Managing pain     If told, put ice on the affected area. To do this: Put ice in a plastic bag. Place a towel between your skin and the bag. Leave the ice on for 20 minutes, 2-3 times a day. If your skin turns bright red, take off the ice right away to prevent skin damage. The risk of skin damage is higher if you cannot feel pain, heat, or cold. If told, put heat on the affected area. Do this as often as told by your doctor. Use the heat source  that your doctor tells you to use, such as a moist heat pack or a heating pad. Place a towel between your skin and the heat source. Leave the heat on for 20-30 minutes. If your skin turns bright red, take off the heat right away to prevent burns. The risk of burns is higher if you cannot feel pain, heat, or cold. Activity  Return to your normal activities when your doctor says that it is safe. Avoid activities that make your symptoms worse. Take short rests during the day. When you rest for a long time, do some physical activity or stretching between periods of rest. Avoid sitting for a long time without moving. Get up and move around at least one time each hour. Do exercises and stretches as told by your doctor. Do not lift anything that is heavier than 10 lb (4.5 kg). Avoid lifting heavy things even when you do not have symptoms. Avoid lifting heavy things over and over. When you lift objects, always lift in a way that is safe for your body. To do this, you should: Bend your knees. Keep the object close to your body. Avoid twisting. General instructions Stay at a healthy weight. Wear comfortable shoes that support your feet. Avoid wearing high heels. Avoid sleeping on a mattress that is too soft or too hard. You might have less pain if you sleep on a mattress that is firm enough to support your back. Contact a doctor if: Your pain is not controlled by medicine. Your pain does not get better. Your pain gets worse. Your pain lasts longer  than 4 weeks. You lose weight without trying. Get help right away if: You cannot control when you pee (urinate) or poop (have a bowel movement). You have weakness in any of these areas and it gets worse: Lower back. The area between your hip bones. Butt. Legs. You have redness or swelling of your back. You have a burning feeling when you pee. Summary Sciatica is pain, weakness, tingling, or loss of feeling (numbness) along the sciatic nerve.  This may include the lower back, legs, hips, and butt. This condition happens when the sciatic nerve is pinched or has pressure put on it. Treatment often includes rest, exercise, medicines, and putting ice or heat on the affected area. This information is not intended to replace advice given to you by your health care provider. Make sure you discuss any questions you have with your health care provider. Document Revised: 08/03/2021 Document Reviewed: 08/03/2021 Elsevier Patient Education  2023 Elsevier Inc.     Edwina Barth, MD [facility] at [facility]

## 2022-08-25 NOTE — Assessment & Plan Note (Signed)
Active and affecting quality of life No injuries.  No red flag signs or symptoms Benign physical examination Mechanical in nature Differential diagnosis discussed X-ray done today.  Report reviewed. Pain management discussed. Recommend diclofenac 75 mg twice a day for the next week Ortho referral if no better

## 2022-08-25 NOTE — Patient Instructions (Signed)
Sciatica  Sciatica is pain, weakness, tingling, or loss of feeling (numbness) along the sciatic nerve. The sciatic nerve starts in the lower back and goes down the back of each leg. Sciatica usually affects one side of the body. Sciatica usually goes away on its own or with treatment. Sometimes, sciatica may come back. What are the causes? This condition happens when the sciatic nerve is pinched or has pressure put on it. This may be caused by: A disk in between the bones of the spine bulging out too far (herniated disk). Changes in the spinal disks due to aging. A condition that affects a muscle in the butt. Extra bone growth near the sciatic nerve. A break (fracture) of the area between your hip bones (pelvis). Pregnancy. Tumor. This is rare. What increases the risk? You are more likely to develop this condition if you: Play sports that put pressure or stress on the spine. Have poor strength and ease of movement (flexibility). Have had a back injury or back surgery. Sit for long periods of time. Do activities that involve bending or lifting over and over again. Are very overweight (obese). What are the signs or symptoms? Symptoms can vary from mild to very bad. They may include: Any of these problems in the lower back, leg, hip, or butt: Mild tingling, loss of feeling, or dull aches. A burning feeling. Sharp pains. Loss of feeling in the back of the calf or the sole of the foot. Leg weakness. Very bad back pain that makes it hard to move. These symptoms may get worse when you cough, sneeze, or laugh. They may also get worse when you sit or stand for long periods of time. How is this treated? This condition often gets better without any treatment. However, treatment may include: Changing or cutting back on physical activity when you have pain. Exercising, including strengthening and stretching. Putting ice or heat on the affected area. Shots of medicines to relieve pain and  swelling or to relax your muscles. Surgery. Follow these instructions at home: Medicines Take over-the-counter and prescription medicines only as told by your doctor. Ask your doctor if you should avoid driving or using machines while you are taking your medicine. Managing pain     If told, put ice on the affected area. To do this: Put ice in a plastic bag. Place a towel between your skin and the bag. Leave the ice on for 20 minutes, 2-3 times a day. If your skin turns bright red, take off the ice right away to prevent skin damage. The risk of skin damage is higher if you cannot feel pain, heat, or cold. If told, put heat on the affected area. Do this as often as told by your doctor. Use the heat source that your doctor tells you to use, such as a moist heat pack or a heating pad. Place a towel between your skin and the heat source. Leave the heat on for 20-30 minutes. If your skin turns bright red, take off the heat right away to prevent burns. The risk of burns is higher if you cannot feel pain, heat, or cold. Activity  Return to your normal activities when your doctor says that it is safe. Avoid activities that make your symptoms worse. Take short rests during the day. When you rest for a long time, do some physical activity or stretching between periods of rest. Avoid sitting for a long time without moving. Get up and move around at least one time each   hour. Do exercises and stretches as told by your doctor. Do not lift anything that is heavier than 10 lb (4.5 kg). Avoid lifting heavy things even when you do not have symptoms. Avoid lifting heavy things over and over. When you lift objects, always lift in a way that is safe for your body. To do this, you should: Bend your knees. Keep the object close to your body. Avoid twisting. General instructions Stay at a healthy weight. Wear comfortable shoes that support your feet. Avoid wearing high heels. Avoid sleeping on a mattress  that is too soft or too hard. You might have less pain if you sleep on a mattress that is firm enough to support your back. Contact a doctor if: Your pain is not controlled by medicine. Your pain does not get better. Your pain gets worse. Your pain lasts longer than 4 weeks. You lose weight without trying. Get help right away if: You cannot control when you pee (urinate) or poop (have a bowel movement). You have weakness in any of these areas and it gets worse: Lower back. The area between your hip bones. Butt. Legs. You have redness or swelling of your back. You have a burning feeling when you pee. Summary Sciatica is pain, weakness, tingling, or loss of feeling (numbness) along the sciatic nerve. This may include the lower back, legs, hips, and butt. This condition happens when the sciatic nerve is pinched or has pressure put on it. Treatment often includes rest, exercise, medicines, and putting ice or heat on the affected area. This information is not intended to replace advice given to you by your health care provider. Make sure you discuss any questions you have with your health care provider. Document Revised: 08/03/2021 Document Reviewed: 08/03/2021 Elsevier Patient Education  2023 Elsevier Inc.  

## 2022-09-15 ENCOUNTER — Telehealth: Payer: Self-pay | Admitting: Internal Medicine

## 2022-09-15 NOTE — Telephone Encounter (Signed)
Contacted G[facility]s I[facility]ings to schedule their annual wellness visit. Appointment made for 09/27/2022.  [facility] Care Guide [facility] AWV TEAM Direct Dial: [phone]

## 2022-10-07 DIAGNOSIS — M2550 Pain in unspecified joint: Secondary | ICD-10-CM | POA: Diagnosis not present

## 2022-10-07 DIAGNOSIS — E119 Type 2 diabetes mellitus without complications: Secondary | ICD-10-CM | POA: Diagnosis not present

## 2022-10-07 DIAGNOSIS — I10 Essential (primary) hypertension: Secondary | ICD-10-CM | POA: Diagnosis not present

## 2022-10-07 DIAGNOSIS — Z0000 Encounter for general adult medical examination without abnormal findings: Secondary | ICD-10-CM | POA: Diagnosis not present

## 2022-10-07 DIAGNOSIS — Z79899 Other long term (current) drug therapy: Secondary | ICD-10-CM | POA: Diagnosis not present

## 2022-10-07 DIAGNOSIS — E559 Vitamin D deficiency, unspecified: Secondary | ICD-10-CM | POA: Diagnosis not present

## 2022-10-25 DIAGNOSIS — Z79899 Other long term (current) drug therapy: Secondary | ICD-10-CM | POA: Diagnosis not present

## 2022-11-22 DIAGNOSIS — E0840 Diabetes mellitus due to underlying condition with diabetic neuropathy, unspecified: Secondary | ICD-10-CM | POA: Diagnosis not present

## 2022-11-22 DIAGNOSIS — E1165 Type 2 diabetes mellitus with hyperglycemia: Secondary | ICD-10-CM | POA: Diagnosis not present

## 2022-11-22 DIAGNOSIS — Z79899 Other long term (current) drug therapy: Secondary | ICD-10-CM | POA: Diagnosis not present

## 2022-11-22 DIAGNOSIS — I509 Heart failure, unspecified: Secondary | ICD-10-CM | POA: Diagnosis not present

## 2022-11-22 DIAGNOSIS — I10 Essential (primary) hypertension: Secondary | ICD-10-CM | POA: Diagnosis not present

## 2022-11-22 DIAGNOSIS — Z1231 Encounter for screening mammogram for malignant neoplasm of breast: Secondary | ICD-10-CM | POA: Diagnosis not present

## 2022-11-29 ENCOUNTER — Telehealth: Payer: Self-pay | Admitting: *Deleted

## 2022-11-29 ENCOUNTER — Encounter: Payer: Self-pay | Admitting: *Deleted

## 2022-11-29 NOTE — Telephone Encounter (Signed)
I attempted to contact patient by telephone but was unsuccessful. According to the patient's chart they are due for annual exam  with [facility]. I have left a HIPAA compliant message advising the patient to contact [facility] at [phone]. I will continue to follow up with the patient to make sure this appointment is scheduled.

## 2022-12-02 NOTE — Telephone Encounter (Signed)
I attempted to contact patient by telephone but was unsuccessful. According to the patient's chart they are due for annual exam  with [facility]. I have left a HIPAA compliant message advising the patient to contact [facility] at [phone]. I will continue to follow up with the patient to make sure this appointment is scheduled.

## 2023-01-06 DIAGNOSIS — I129 Hypertensive chronic kidney disease with stage 1 through stage 4 chronic kidney disease, or unspecified chronic kidney disease: Secondary | ICD-10-CM | POA: Diagnosis not present

## 2023-01-06 DIAGNOSIS — E1140 Type 2 diabetes mellitus with diabetic neuropathy, unspecified: Secondary | ICD-10-CM | POA: Diagnosis not present

## 2023-01-06 DIAGNOSIS — Z794 Long term (current) use of insulin: Secondary | ICD-10-CM | POA: Diagnosis not present

## 2023-01-06 DIAGNOSIS — Z833 Family history of diabetes mellitus: Secondary | ICD-10-CM | POA: Diagnosis not present

## 2023-01-06 DIAGNOSIS — M5440 Lumbago with sciatica, unspecified side: Secondary | ICD-10-CM | POA: Diagnosis not present

## 2023-01-06 DIAGNOSIS — E1122 Type 2 diabetes mellitus with diabetic chronic kidney disease: Secondary | ICD-10-CM | POA: Diagnosis not present

## 2023-01-06 DIAGNOSIS — Z823 Family history of stroke: Secondary | ICD-10-CM | POA: Diagnosis not present

## 2023-01-06 DIAGNOSIS — N181 Chronic kidney disease, stage 1: Secondary | ICD-10-CM | POA: Diagnosis not present

## 2023-01-24 ENCOUNTER — Other Ambulatory Visit: Payer: Self-pay | Admitting: Internal Medicine

## 2023-07-29 ENCOUNTER — Other Ambulatory Visit: Payer: Self-pay | Admitting: Student

## 2023-07-29 DIAGNOSIS — E2839 Other primary ovarian failure: Secondary | ICD-10-CM

## 2023-08-08 ENCOUNTER — Other Ambulatory Visit: Payer: Self-pay | Admitting: Student

## 2023-08-08 DIAGNOSIS — Z1231 Encounter for screening mammogram for malignant neoplasm of breast: Secondary | ICD-10-CM

## 2023-08-12 DIAGNOSIS — Z789 Other specified health status: Secondary | ICD-10-CM | POA: Diagnosis not present

## 2023-08-12 DIAGNOSIS — Z719 Counseling, unspecified: Secondary | ICD-10-CM | POA: Diagnosis not present

## 2023-10-13 DIAGNOSIS — E1165 Type 2 diabetes mellitus with hyperglycemia: Secondary | ICD-10-CM | POA: Diagnosis not present

## 2023-12-09 ENCOUNTER — Ambulatory Visit: Admitting: Podiatry

## 2023-12-09 ENCOUNTER — Ambulatory Visit (INDEPENDENT_AMBULATORY_CARE_PROVIDER_SITE_OTHER)

## 2023-12-09 ENCOUNTER — Encounter: Payer: Self-pay | Admitting: Podiatry

## 2023-12-09 DIAGNOSIS — M79672 Pain in left foot: Secondary | ICD-10-CM | POA: Diagnosis not present

## 2023-12-09 DIAGNOSIS — M79671 Pain in right foot: Secondary | ICD-10-CM | POA: Diagnosis not present

## 2023-12-09 DIAGNOSIS — M7731 Calcaneal spur, right foot: Secondary | ICD-10-CM

## 2023-12-09 DIAGNOSIS — M7732 Calcaneal spur, left foot: Secondary | ICD-10-CM

## 2023-12-09 DIAGNOSIS — M722 Plantar fascial fibromatosis: Secondary | ICD-10-CM | POA: Diagnosis not present

## 2023-12-09 MED ORDER — TRIAMCINOLONE ACETONIDE 10 MG/ML IJ SUSP
10.0000 mg | Freq: Once | INTRAMUSCULAR | Status: AC
  Administered 2023-12-09: 10 mg

## 2023-12-09 NOTE — Patient Instructions (Signed)

## 2023-12-09 NOTE — Progress Notes (Signed)
 Patient presents today with complaint along the heels and the arch bilaterally.  They have been bothering her for several weeks now.  She also has neuropathy for which she is taking gabapentin.  She is a type II diabetic.  Has not noticed any redness or swelling around the feet.  Does not recall any injury to the feet.   Physical exam:  General appearance: Pleasant, and in no acute distress. AOx3.  Vascular: Pedal pulses: DP 2/4 bilaterally, PT 2/4 bilaterally.  Mild edema lower legs bilaterally. Capillary fill time immediate bilaterally mild.  Neurological: Light touch intact feet bilaterally.  Normal Achilles reflex bilaterally.  No clonus or spasticity noted.  Negative Tinel's sign at tarsal tunnel and porta pedis bilaterally  Dermatologic:   Skin normal temperature bilaterally.  Skin normal color, tone, and texture bilaterally.   Musculoskeletal: Tenderness plantar medial aspect heel at plantar calcaneal tubercle medially bilaterally.  No tenderness with lateral compression of calcaneus.  No tendon posterior heel pain.  No fibromas noted.  Radiographs: 3 views feet bilaterally: Plantar calcaneal spurs.  No erosive changes or evidence of stress fractures at the calcaneus.  No evidence of any bone tumors. 2.  Diagnosis: 1.  Pain feet bilaterally 2.  Plantar fasciitis bilaterally  3.  Calcaneal spurs bilaterally  Plan: -New patient office visit for evaluation and management level 3.  Modifier 25. - Discussed plantar fasciitis and etiology and treatment.  Discussed proper shoes.  Avoid flat soled shoes. - Dispensed at home exercise and icing instructions she can do. -injected 3cc 2:1 mixture 0.5 cc Marcaine:Kenolog 10mg /55ml at medial origin plantar fascia at medial calcaneal tubercle right.     Return 1 week follow-up injection plantar fascia right

## 2023-12-23 ENCOUNTER — Ambulatory Visit: Admitting: Podiatry

## 2024-01-10 ENCOUNTER — Ambulatory Visit: Admitting: Podiatry

## 2024-01-24 ENCOUNTER — Ambulatory Visit: Admitting: Podiatry

## 2024-01-24 ENCOUNTER — Encounter: Payer: Self-pay | Admitting: Podiatry

## 2024-01-24 VITALS — Ht 62.0 in | Wt 118.0 lb

## 2024-01-24 DIAGNOSIS — M722 Plantar fascial fibromatosis: Secondary | ICD-10-CM | POA: Diagnosis not present

## 2024-01-24 MED ORDER — TRIAMCINOLONE ACETONIDE 10 MG/ML IJ SUSP
10.0000 mg | Freq: Once | INTRAMUSCULAR | Status: AC
  Administered 2024-01-24: 10 mg

## 2024-01-24 NOTE — Patient Instructions (Signed)

## 2024-01-24 NOTE — Progress Notes (Signed)
 Complaint of pain mostly at the left heel.  Right heel is doing much better probably 95% better since the injection.  Hurts for Sandra Petersen in the morning when she gets up on the left.   Physical exam:  General appearance: Pleasant, and in no acute distress. AOx3.  Vascular: Pedal pulses: DP 2/4 bilaterally, PT 2/4 bilaterally.  Mild edema lower legs bilaterally. Capillary fill time immediate bilaterally.  Neurological: Negative Tinel's sign tarsal tunnel and porta pedis bilaterally  Dermatologic:   Skin normal temperature bilaterally.  Skin normal color, tone, and texture bilaterally.   Musculoskeletal: Tenderness of the plantar medial aspect of the heel left at the medial calcaneal tubercle.  No tenderness lateral compression the calcaneus bilaterally.  Some soreness at the plantar medial aspect of the Medial calcaneal tuberosity on the right   Diagnosis: 1.  Plantar fasciitis left.  Plan: -Continue wearing good supportive shoes.  Stretch and ice as necessary. -injected 3cc 2:1 mixture 0.5 cc Marcaine:Kenolog 10mg /44ml at plantar fascia origin at medial plantar calcaneal tubercle left.   -Return 2 weeks follow-up injection left plantar fascia

## 2024-02-07 ENCOUNTER — Ambulatory Visit: Admitting: Podiatry

## 2024-04-24 ENCOUNTER — Other Ambulatory Visit

## 2024-04-24 ENCOUNTER — Ambulatory Visit
